# Patient Record
Sex: Male | Born: 1950 | Race: White | Hispanic: No | Marital: Married | Smoking: Current every day smoker
Health system: Southern US, Community
[De-identification: ages and names within clinical notes are randomized; demographics above are authoritative.]

## PROBLEM LIST (undated history)

## (undated) DIAGNOSIS — T7840XA Allergy, unspecified, initial encounter: Secondary | ICD-10-CM

## (undated) DIAGNOSIS — C61 Malignant neoplasm of prostate: Secondary | ICD-10-CM

## (undated) DIAGNOSIS — R972 Elevated prostate specific antigen [PSA]: Secondary | ICD-10-CM

## (undated) DIAGNOSIS — M1990 Unspecified osteoarthritis, unspecified site: Secondary | ICD-10-CM

## (undated) DIAGNOSIS — I10 Essential (primary) hypertension: Secondary | ICD-10-CM

## (undated) HISTORY — PX: JOINT REPLACEMENT: SHX530

## (undated) HISTORY — PX: KNEE SURGERY: SHX244

## (undated) HISTORY — PX: TONSILLECTOMY: SUR1361

## (undated) HISTORY — PX: APPENDECTOMY: SHX54

---

## 2014-07-31 ENCOUNTER — Telehealth: Payer: Self-pay | Admitting: Family Medicine

## 2014-07-31 NOTE — Telephone Encounter (Signed)
Pt given appt with Dr.Miller 4/5 at 2:30, advised to arrive 30 minutes prior.

## 2014-08-06 ENCOUNTER — Ambulatory Visit: Payer: Self-pay | Admitting: Family Medicine

## 2014-09-23 ENCOUNTER — Encounter (INDEPENDENT_AMBULATORY_CARE_PROVIDER_SITE_OTHER): Payer: Self-pay

## 2014-09-23 ENCOUNTER — Encounter: Payer: Self-pay | Admitting: Family Medicine

## 2014-09-23 ENCOUNTER — Ambulatory Visit (INDEPENDENT_AMBULATORY_CARE_PROVIDER_SITE_OTHER): Payer: BC Managed Care – PPO | Admitting: Family Medicine

## 2014-09-23 VITALS — BP 146/90 | HR 86 | Temp 97.3°F | Ht 67.0 in | Wt 240.0 lb

## 2014-09-23 DIAGNOSIS — L723 Sebaceous cyst: Secondary | ICD-10-CM

## 2014-09-23 DIAGNOSIS — L739 Follicular disorder, unspecified: Secondary | ICD-10-CM

## 2014-09-23 DIAGNOSIS — B356 Tinea cruris: Secondary | ICD-10-CM | POA: Diagnosis not present

## 2014-09-23 MED ORDER — TERBINAFINE HCL 250 MG PO TABS: 250.0000 mg | ORAL_TABLET | Freq: Every day | ORAL | Status: DC

## 2014-09-23 MED ORDER — NYSTATIN-TRIAMCINOLONE 100000-0.1 UNIT/GM-% EX CREA: 1.0000 "application " | TOPICAL_CREAM | Freq: Two times a day (BID) | CUTANEOUS | Status: DC

## 2014-09-23 NOTE — Patient Instructions (Signed)
Health Maintenance A healthy lifestyle and preventative care can promote health and wellness.  Maintain regular health, dental, and eye exams.  Eat a healthy diet. Foods like vegetables, fruits, whole grains, low-fat dairy products, and lean protein foods contain the nutrients you need and are low in calories. Decrease your intake of foods high in solid fats, added sugars, and salt. Get information about a proper diet from your health care provider, if necessary.  Regular physical exercise is one of the most important things you can do for your health. Most adults should get at least 150 minutes of moderate-intensity exercise (any activity that increases your heart rate and causes you to sweat) each week. In addition, most adults need muscle-strengthening exercises on 2 or more days a week.   Maintain a healthy weight. The body mass index (BMI) is a screening tool to identify possible weight problems. It provides an estimate of body fat based on height and weight. Your health care provider can find your BMI and can help you achieve or maintain a healthy weight. For males 20 years and older:  A BMI below 18.5 is considered underweight.  A BMI of 18.5 to 24.9 is normal.  A BMI of 25 to 29.9 is considered overweight.  A BMI of 30 and above is considered obese.  Maintain normal blood lipids and cholesterol by exercising and minimizing your intake of saturated fat. Eat a balanced diet with plenty of fruits and vegetables. Blood tests for lipids and cholesterol should begin at age 20 and be repeated every 5 years. If your lipid or cholesterol levels are high, you are over age 50, or you are at high risk for heart disease, you may need your cholesterol levels checked more frequently.Ongoing high lipid and cholesterol levels should be treated with medicines if diet and exercise are not working.  If you smoke, find out from your health care provider how to quit. If you do not use tobacco, do not  start.  Lung cancer screening is recommended for adults aged 55-80 years who are at high risk for developing lung cancer because of a history of smoking. A yearly low-dose CT scan of the lungs is recommended for people who have at least a 30-pack-year history of smoking and are current smokers or have quit within the past 15 years. A pack year of smoking is smoking an average of 1 pack of cigarettes a day for 1 year (for example, a 30-pack-year history of smoking could mean smoking 1 pack a day for 30 years or 2 packs a day for 15 years). Yearly screening should continue until the smoker has stopped smoking for at least 15 years. Yearly screening should be stopped for people who develop a health problem that would prevent them from having lung cancer treatment.  If you choose to drink alcohol, do not have more than 2 drinks per day. One drink is considered to be 12 oz (360 mL) of beer, 5 oz (150 mL) of wine, or 1.5 oz (45 mL) of liquor.  Avoid the use of street drugs. Do not share needles with anyone. Ask for help if you need support or instructions about stopping the use of drugs.  High blood pressure causes heart disease and increases the risk of stroke. Blood pressure should be checked at least every 1-2 years. Ongoing high blood pressure should be treated with medicines if weight loss and exercise are not effective.  If you are 45-79 years old, ask your health care provider if   you should take aspirin to prevent heart disease.  Diabetes screening involves taking a blood sample to check your fasting blood sugar level. This should be done once every 3 years after age 45 if you are at a normal weight and without risk factors for diabetes. Testing should be considered at a younger age or be carried out more frequently if you are overweight and have at least 1 risk factor for diabetes.  Colorectal cancer can be detected and often prevented. Most routine colorectal cancer screening begins at the age of 50  and continues through age 75. However, your health care provider may recommend screening at an earlier age if you have risk factors for colon cancer. On a yearly basis, your health care provider may provide home test kits to check for hidden blood in the stool. A small camera at the end of a tube may be used to directly examine the colon (sigmoidoscopy or colonoscopy) to detect the earliest forms of colorectal cancer. Talk to your health care provider about this at age 50 when routine screening begins. A direct exam of the colon should be repeated every 5-10 years through age 75, unless early forms of precancerous polyps or small growths are found.  People who are at an increased risk for hepatitis B should be screened for this virus. You are considered at high risk for hepatitis B if:  You were born in a country where hepatitis B occurs often. Talk with your health care provider about which countries are considered high risk.  Your parents were born in a high-risk country and you have not received a shot to protect against hepatitis B (hepatitis B vaccine).  You have HIV or AIDS.  You use needles to inject street drugs.  You live with, or have sex with, someone who has hepatitis B.  You are a man who has sex with other men (MSM).  You get hemodialysis treatment.  You take certain medicines for conditions like cancer, organ transplantation, and autoimmune conditions.  Hepatitis C blood testing is recommended for all people born from 1945 through 1965 and any individual with known risk factors for hepatitis C.  Healthy men should no longer receive prostate-specific antigen (PSA) blood tests as part of routine cancer screening. Talk to your health care provider about prostate cancer screening.  Testicular cancer screening is not recommended for adolescents or adult males who have no symptoms. Screening includes self-exam, a health care provider exam, and other screening tests. Consult with your  health care provider about any symptoms you have or any concerns you have about testicular cancer.  Practice safe sex. Use condoms and avoid high-risk sexual practices to reduce the spread of sexually transmitted infections (STIs).  You should be screened for STIs, including gonorrhea and chlamydia if:  You are sexually active and are younger than 24 years.  You are older than 24 years, and your health care provider tells you that you are at risk for this type of infection.  Your sexual activity has changed since you were last screened, and you are at an increased risk for chlamydia or gonorrhea. Ask your health care provider if you are at risk.  If you are at risk of being infected with HIV, it is recommended that you take a prescription medicine daily to prevent HIV infection. This is called pre-exposure prophylaxis (PrEP). You are considered at risk if:  You are a man who has sex with other men (MSM).  You are a heterosexual man who   is sexually active with multiple partners.  You take drugs by injection.  You are sexually active with a partner who has HIV.  Talk with your health care provider about whether you are at high risk of being infected with HIV. If you choose to begin PrEP, you should first be tested for HIV. You should then be tested every 3 months for as long as you are taking PrEP.  Use sunscreen. Apply sunscreen liberally and repeatedly throughout the day. You should seek shade when your shadow is shorter than you. Protect yourself by wearing long sleeves, pants, a wide-brimmed hat, and sunglasses year round whenever you are outdoors.  Tell your health care provider of new moles or changes in moles, especially if there is a change in shape or color. Also, tell your health care provider if a mole is larger than the size of a pencil eraser.  A one-time screening for abdominal aortic aneurysm (AAA) and surgical repair of large AAAs by ultrasound is recommended for men aged  68-75 years who are current or former smokers.  Stay current with your vaccines (immunizations). Document Released: 10/16/2007 Document Revised: 04/24/2013 Document Reviewed: 09/14/2010 [facility] Patient Information 2015 [place], [place]. This information is not intended to replace advice given to you by your health care provider. Make sure you discuss any questions you have with your health care provider.   Miconazole skin gel, lotion, ointment, or solution What is this medicine? MICONAZOLE (mi KON a zole) is an antifungal medicine. It is used to treat certain kinds of fungal or yeast infections of the skin. This medicine may be used for other purposes; ask your health care provider or pharmacist if you have questions. COMMON BRAND NAME(S): AZOLEN TINCTURE, Fungoid, Triple Paste AF, Zeasorb AF What should I tell my health care provider before I take this medicine? They need to know if you have any of these conditions: -diabetes -HIV or AIDS -immune system problems -other chronic health condition -recent chemotherapy treatments -an unusual or allergic reaction to miconazole, other medicines, foods, dyes or preservatives -pregnant or trying to get pregnant -breast-feeding How should I use this medicine? This medicine is for external use only. Do not take by mouth. Follow the directions on the label. Wash hands before and after use. If treating hands, only wash hands before use. Cleanse and dry affected area thoroughly. Apply a thin layer of this medicine to the affected skin and surrounding area. Do not get this medicine in your eyes. If you do, rinse out with plenty of cool tap water. You can cover the area with a sterile gauze dressing (bandage). Do not use an airtight bandage (such as a plastic-covered bandage). Do not use your medicine more often than directed. Use this medicine for the full amount of time recommended on the package or by your doctor or health care professional even if you  begin to feel better. Do not use for more than 4 weeks without advice. Talk to your pediatrician regarding the use of this medicine in children. Special care may be needed. Overdosage: If you think you have taken too much of this medicine contact a poison control center or emergency room at once. NOTE: This medicine is only for you. Do not share this medicine with others. What if I miss a dose? If you miss a dose, use it as soon as you can. If it is almost time for your next dose, use only that dose. Do not use double or extra doses. What may interact  with this medicine? Interactions are not expected. Do not use any other skin products on the affected area without asking your doctor or health care professional. This list may not describe all possible interactions. Give your health care provider a list of all the medicines, herbs, non-prescription drugs, or dietary supplements you use. Also tell them if you smoke, drink alcohol, or use illegal drugs. Some items may interact with your medicine. What should I watch for while using this medicine? Tell your doctor or healthcare professional if your symptoms do not start to get better or if they get worse. You may have a skin infection that does not respond to this medicine. If you are using this medicine for 'jock itch' be sure to dry the groin completely after bathing. Do not wear underwear that is tight-fitting or made from synthetic fibers like rayon or nylon. Wear loose-fitting, cotton underwear. If you are using this medicine for athlete's foot be sure to dry your feet carefully after bathing, especially between the toes. Do not wear socks made from wool or synthetic materials like rayon or nylon. Wear clean cotton socks and change them at least once a day, change them more if your feet sweat a lot. Also, try to wear sandals or shoes that are well-ventilated. What side effects may I notice from receiving this medicine? Side effects that you should  report to your doctor or health care professional as soon as possible: -allergic reactions like skin rash, itching or hives, swelling of the face, lips, or tongue -increased inflammation, redness, or pain at the affected area Side effects that usually do not require medical attention (report to your doctor or health care professional if they continue or are bothersome): -mild skin irritation, burning, or itching at the affected area This list may not describe all possible side effects. Call your doctor for medical advice about side effects. You may report side effects to FDA at 1-800-FDA-1088. Where should I keep my medicine? Keep out of the reach of children. Store at room temperature between 15 and 30 degrees C (59 and 86 degrees F). Throw away any unused medicine after the expiration date. NOTE: This sheet is a summary. It may not cover all possible information. If you have questions about this medicine, talk to your doctor, pharmacist, or health care provider.  2015, [employer]vier/[employer]. (2007-11-08 15:11:44)

## 2014-09-23 NOTE — Addendum Note (Signed)
Addended by: Ilean China on: 09/23/2014 08:55 AM   Modules accepted: Orders

## 2014-09-23 NOTE — Progress Notes (Signed)
   Subjective:    Patient ID: Darrell Beard., male    DOB: [birth year], 64 y.o.   MRN: [id number]  HPI first visit for this 64 year old gentleman with several skin concerns. First he has a cutaneous horn left nasolabial fold that he has treated with with over-the-counter liquid nitrogen without success. He also has a sebaceous cyst right flank area. There is also a jock itch and some redness and itching around the head of the penis. There is itching related to that. Also there is a rash on his upper back that has gotten worse since he started sleeping in a leather recliner.     Review of Systems  Constitutional: Negative.   Respiratory: Negative.   Cardiovascular: Negative.   Gastrointestinal: Negative.   Neurological: Negative.   Psychiatric/Behavioral: Negative.        Objective:   Physical Exam  Skin:  Cutaneous horn or papilloma on face frozen with liquid nitrogen  Rash ingrown 1 consistent with tinea cruris but also may have some monilia at the glans  Sebaceous cyst is not inflamed or infected but does not feel like a well-circumscribed cyst it is fluctuant however  Rash on back has small pustules consistent with folliculitis.     BP 146/90 mmHg  Pulse 86  Temp(Src) 97.3 F (36.3 C) (Oral)  Ht 5\' 7"  (1.702 m)  Wt 240 lb (108.863 kg)  BMI 37.58 kg/m2      Assessment & Plan:  1. Folliculitis Scrub skin with antibacterial soap such as Dial. Try to reduce sweating putting a towel between his back and the leather chair.  2. Sebaceous cyst This will need to be indeed in the future  3. Tinea cruris Rx Lamisil tabs 250 mg one a day for 10 days. Zeasorb-AF powder to keep moisture at minimal. Mycolog cream twice a day  Wardell Honour MD

## 2014-10-16 ENCOUNTER — Ambulatory Visit: Payer: BC Managed Care – PPO | Admitting: Physician Assistant

## 2015-05-14 ENCOUNTER — Ambulatory Visit (INDEPENDENT_AMBULATORY_CARE_PROVIDER_SITE_OTHER): Payer: BC Managed Care – PPO

## 2015-05-14 ENCOUNTER — Encounter: Payer: Self-pay | Admitting: Family Medicine

## 2015-05-14 ENCOUNTER — Ambulatory Visit (INDEPENDENT_AMBULATORY_CARE_PROVIDER_SITE_OTHER): Payer: BC Managed Care – PPO | Admitting: Family Medicine

## 2015-05-14 VITALS — BP 162/91 | HR 106 | Temp 98.3°F | Ht 67.0 in | Wt 244.0 lb

## 2015-05-14 DIAGNOSIS — R1030 Lower abdominal pain, unspecified: Secondary | ICD-10-CM

## 2015-05-14 DIAGNOSIS — K5732 Diverticulitis of large intestine without perforation or abscess without bleeding: Secondary | ICD-10-CM

## 2015-05-14 LAB — POCT URINALYSIS DIPSTICK
Bilirubin, UA: NEGATIVE
GLUCOSE UA: NEGATIVE
Ketones, UA: NEGATIVE
Leukocytes, UA: NEGATIVE
Nitrite, UA: NEGATIVE
Protein, UA: NEGATIVE
SPEC GRAV UA: 1.015
Urobilinogen, UA: NEGATIVE
pH, UA: 6

## 2015-05-14 LAB — POCT UA - MICROSCOPIC ONLY
Bacteria, U Microscopic: NEGATIVE
Casts, Ur, LPF, POC: NEGATIVE
Crystals, Ur, HPF, POC: NEGATIVE
MUCUS UA: NEGATIVE
WBC, Ur, HPF, POC: NEGATIVE
YEAST UA: NEGATIVE

## 2015-05-14 MED ORDER — METRONIDAZOLE 500 MG PO TABS: 500.0000 mg | ORAL_TABLET | Freq: Two times a day (BID) | ORAL | Status: DC

## 2015-05-14 MED ORDER — PSYLLIUM 28 % PO PACK: PACK | ORAL | Status: DC

## 2015-05-14 MED ORDER — CIPROFLOXACIN HCL 500 MG PO TABS: 500.0000 mg | ORAL_TABLET | Freq: Two times a day (BID) | ORAL | Status: DC

## 2015-05-14 NOTE — Progress Notes (Signed)
Subjective:  Patient ID: Darrell Beard., male    DOB: 10/03/1950  Age: 65 y.o. MRN: [id number]  CC: Abdominal Pain   HPI [name]. presents for 4 days of sharp stabbing "Like jabbed with an ice pick." Pain at suprapubic area. 6-7/10. INtermittent. LAsts sfrom a few minutes to several hours at a time. No NVD,C. Appetite good. No melena. On no meds currently. Smoking 2 packs of cigarettes a day in the past now down to half pack today. 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 He continues to try. Patient also denies previous episode of similar symptoms. This is not affected by food and his appetite is unaffected.  History Deepak has no past medical history on file.   He has past surgical history that includes Appendectomy; Tonsillectomy; and Knee surgery (Bilateral).   His family history includes Cancer in his father.He reports that he has been smoking.  He has never used smokeless tobacco. He reports that he drinks alcohol. He reports that he does not use illicit drugs.  Outpatient Prescriptions Prior to Visit  Medication Sig Dispense Refill  . nystatin-triamcinolone (MYCOLOG II) cream Apply 1 application topically 2 (two) times daily. 30 g 0  . terbinafine (LAMISIL) 250 MG tablet Take 1 tablet (250 mg total) by mouth daily. 14 tablet 0   No facility-administered medications prior to visit.    ROS Review of Systems  Constitutional: Negative for fever, chills, diaphoresis and unexpected weight change.  HENT: Negative for rhinorrhea and trouble swallowing.   Respiratory: Negative for cough, chest tightness and shortness of breath.   Cardiovascular: Negative for chest pain.  Gastrointestinal: Positive for abdominal pain. Negative for nausea, vomiting, diarrhea, constipation, blood in stool, abdominal distention and rectal pain.  Genitourinary: Negative for dysuria, hematuria and flank pain.    Musculoskeletal: Negative for joint swelling and arthralgias.  Skin: Negative for rash.  Neurological: Negative for syncope and headaches.    Objective:  BP 162/91 mmHg  Pulse 106  Temp(Src) 98.3 F (36.8 C) (Oral)  Ht _0  (1.702 m)  Wt 244 lb (110.678 kg)  BMI 38.21 kg/m2  SpO2 99%  BP Readings from Last 3 Encounters:  05/14/15 162/91  09/23/14 146/90    Wt Readings from Last 3 Encounters:  05/14/15 244 lb (110.678 kg)  09/23/14 240 lb (108.863 kg)     Physical Exam  Constitutional: He is oriented to person, place, and time. He appears well-developed and well-nourished. No distress.  HENT:  Head: Normocephalic and atraumatic.  Right Ear: External ear normal.  Left Ear: External ear normal.  Nose: Nose normal.  Mouth/Throat: Oropharynx is clear and moist.  Eyes: Conjunctivae and EOM are normal. Pupils are equal, round, and reactive to light.  Neck: Normal range of motion. Neck supple. No thyromegaly present.  Cardiovascular: Normal rate, regular rhythm and normal heart sounds.   No murmur heard. Pulmonary/Chest: Effort normal and breath sounds normal. No respiratory distress. He has no wheezes. He has no rales.  Abdominal: Soft. Bowel sounds are normal. He exhibits distension. He exhibits no mass. There is tenderness (LLQ, moderate and into suprapubic region just to right of midline.). There is guarding (mild). There is no rebound.  Lymphadenopathy:    He has no cervical adenopathy.  Neurological: He is alert and oriented to person, place, and time. He has normal reflexes.  Skin: Skin is warm and dry.  Psychiatric:  He has a normal mood and affect. His behavior is normal. Judgment and thought content normal.     Results for orders placed or performed in visit on 05/14/15  Amylase  Result Value Ref Range   Amylase 41 31 - 124 U/L  CBC with Differential/Platelet  Result Value Ref Range   WBC 14.0 (H) 3.4 - 10.8 x10E3/uL   RBC 5.16 4.14 - 5.80 x10E6/uL    Hemoglobin 16.1 12.6 - 17.7 g/dL   Hematocrit 46.4 37.5 - 51.0 %   MCV 90 79 - 97 fL   MCH 31.2 26.6 - 33.0 pg   MCHC 34.7 31.5 - 35.7 g/dL   RDW 13.2 12.3 - 15.4 %   Platelets 319 150 - 379 x10E3/uL   Neutrophils 72 %   Lymphs 15 %   Monocytes 11 %   Eos 1 %   Basos 1 %   Neutrophils Absolute 10.0 (H) 1.4 - 7.0 x10E3/uL   Lymphocytes Absolute 2.2 0.7 - 3.1 x10E3/uL   Monocytes Absolute 1.6 (H) 0.1 - 0.9 x10E3/uL   EOS (ABSOLUTE) 0.1 0.0 - 0.4 x10E3/uL   Basophils Absolute 0.1 0.0 - 0.2 x10E3/uL   Immature Granulocytes 0 %   Immature Grans (Abs) 0.0 0.0 - 0.1 x10E3/uL  CMP14+EGFR  Result Value Ref Range   Glucose 95 65 - 99 mg/dL   BUN 13 8 - 27 mg/dL   Creatinine, Ser 1.10 0.76 - 1.27 mg/dL   GFR calc non Af Amer 71 >59 mL/min/1.73   GFR calc Af Amer 82 >59 mL/min/1.73   BUN/Creatinine Ratio 12 10 - 22   Sodium 141 134 - 144 mmol/L   Potassium 4.6 3.5 - 5.2 mmol/L   Chloride 98 96 - 106 mmol/L   CO2 25 18 - 29 mmol/L   Calcium 9.2 8.6 - 10.2 mg/dL   Total Protein 6.7 6.0 - 8.5 g/dL   Albumin 3.8 3.6 - 4.8 g/dL   Globulin, Total 2.9 1.5 - 4.5 g/dL   Albumin/Globulin Ratio 1.3 1.1 - 2.5   Bilirubin Total 0.7 0.0 - 1.2 mg/dL   Alkaline Phosphatase 85 39 - 117 IU/L   AST 16 0 - 40 IU/L   ALT 18 0 - 44 IU/L  Lipase  Result Value Ref Range   Lipase 26 0 - 59 U/L  Hepatitis C antibody  Result Value Ref Range   Hep C Virus Ab <0.1 0.0 - 0.9 s/co ratio  POCT urinalysis dipstick  Result Value Ref Range   Color, UA straw    Clarity, UA clear    Glucose, UA neg    Bilirubin, UA neg    Ketones, UA neg    Spec Grav, UA 1.015    Blood, UA trace    pH, UA 6.0    Protein, UA neg    Urobilinogen, UA negative    Nitrite, UA neg    Leukocytes, UA Negative Negative  POCT UA - Microscopic Only  Result Value Ref Range   WBC, Ur, HPF, POC neg    RBC, urine, microscopic 1-5    Bacteria, U Microscopic neg    Mucus, UA neg    Epithelial cells, urine per micros occ     Crystals, Ur, HPF, POC neg    Casts, Ur, LPF, POC neg    Yeast, UA neg     Patient was never admitted.  Assessment & Plan:   Anthony was seen today for abdominal pain.  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 -     ciprofloxacin (CIPRO) 500 MG tablet; Take 1 tablet (500 mg total) by mouth 2 (two) times daily. -     psyllium (METAMUCIL SMOOTH TEXTURE) 28 % packet; One tbsp twice daily for bowels   I have discontinued Mr. Stgermaine terbinafine and nystatin-triamcinolone. I am also having him start on metroNIDAZOLE, ciprofloxacin, and psyllium.  Meds ordered this encounter  Medications  . metroNIDAZOLE (FLAGYL) 500 MG tablet    Sig: Take 1 tablet (500 mg total) by mouth 2 (two) times daily.    Dispense:  20 tablet    Refill:  0  . ciprofloxacin (CIPRO) 500 MG tablet    Sig: Take 1 tablet (500 mg total) by mouth 2 (two) times daily.    Dispense:  20 tablet    Refill:  0  . psyllium (METAMUCIL SMOOTH TEXTURE) 28 % packet    Sig: One tbsp twice daily for bowels    Dispense:  60 packet    Refill:  11     Follow-up: Return if symptoms worsen or fail to improve.  Claretta Fraise, M.D.

## 2015-05-14 NOTE — Patient Instructions (Signed)
Diverticulitis °Diverticulitis is inflammation or infection of small pouches in your colon that form when you have a condition called diverticulosis. The pouches in your colon are called diverticula. Your colon, or large intestine, is where water is absorbed and stool is formed. °Complications of diverticulitis can include: °· Bleeding. °· Severe infection. °· Severe pain. °· Perforation of your colon. °· Obstruction of your colon. °CAUSES  °Diverticulitis is caused by bacteria. °Diverticulitis happens when stool becomes trapped in diverticula. This allows bacteria to grow in the diverticula, which can lead to inflammation and infection. °RISK FACTORS °People with diverticulosis are at risk for diverticulitis. Eating a diet that does not include enough fiber from fruits and vegetables may make diverticulitis more likely to develop. °SYMPTOMS  °Symptoms of diverticulitis may include: °· Abdominal pain and tenderness. The pain is normally located on the left side of the abdomen, but may occur in other areas. °· Fever and chills. °· Bloating. °· Cramping. °· Nausea. °· Vomiting. °· Constipation. °· Diarrhea. °· Blood in your stool. °DIAGNOSIS  °Your health care provider will ask you about your medical history and do a physical exam. You may need to have tests done because many medical conditions can cause the same symptoms as diverticulitis. Tests may include: °· Blood tests. °· Urine tests. °· Imaging tests of the abdomen, including X-rays and CT scans. °When your condition is under control, your health care provider may recommend that you have a colonoscopy. A colonoscopy can show how severe your diverticula are and whether something else is causing your symptoms. °TREATMENT  °Most cases of diverticulitis are mild and can be treated at home. Treatment may include: °· Taking over-the-counter pain medicines. °· Following a clear liquid diet. °· Taking antibiotic medicines by mouth for 7-10 days. °More severe cases may  be treated at a hospital. Treatment may include: °· Not eating or drinking. °· Taking prescription pain medicine. °· Receiving antibiotic medicines through an IV tube. °· Receiving fluids and nutrition through an IV tube. °· Surgery. °HOME CARE INSTRUCTIONS  °· Follow your health care provider's instructions carefully. °· Follow a full liquid diet or other diet as directed by your health care provider. After your symptoms improve, your health care provider may tell you to change your diet. He or she may recommend you eat a high-fiber diet. Fruits and vegetables are good sources of fiber. Fiber makes it easier to pass stool. °· Take fiber supplements or probiotics as directed by your health care provider. °· Only take medicines as directed by your health care provider. °· Keep all your follow-up appointments. °SEEK MEDICAL CARE IF:  °· Your pain does not improve. °· You have a hard time eating food. °· Your bowel movements do not return to normal. °SEEK IMMEDIATE MEDICAL CARE IF:  °· Your pain becomes worse. °· Your symptoms do not get better. °· Your symptoms suddenly get worse. °· You have a fever. °· You have repeated vomiting. °· You have bloody or black, tarry stools. °MAKE SURE YOU:  °· Understand these instructions. °· Will watch your condition. °· Will get help right away if you are not doing well or get worse. °  °This information is not intended to replace advice given to you by your health care provider. Make sure you discuss any questions you have with your health care provider. °  °Document Released: 01/27/2005 Document Revised: 04/24/2013 Document Reviewed: 03/14/2013 °Elsevier Interactive Patient Education ©2016 [employer]. ° °

## 2015-05-15 LAB — CBC WITH DIFFERENTIAL/PLATELET
BASOS ABS: 0.1 10*3/uL (ref 0.0–0.2)
Basos: 1 %
EOS (ABSOLUTE): 0.1 10*3/uL (ref 0.0–0.4)
Eos: 1 %
Hematocrit: 46.4 % (ref 37.5–51.0)
Hemoglobin: 16.1 g/dL (ref 12.6–17.7)
IMMATURE GRANULOCYTES: 0 %
Immature Grans (Abs): 0 10*3/uL (ref 0.0–0.1)
Lymphocytes Absolute: 2.2 10*3/uL (ref 0.7–3.1)
Lymphs: 15 %
MCH: 31.2 pg (ref 26.6–33.0)
MCHC: 34.7 g/dL (ref 31.5–35.7)
MCV: 90 fL (ref 79–97)
MONOS ABS: 1.6 10*3/uL — AB (ref 0.1–0.9)
Monocytes: 11 %
Neutrophils Absolute: 10 10*3/uL — ABNORMAL HIGH (ref 1.4–7.0)
Neutrophils: 72 %
Platelets: 319 10*3/uL (ref 150–379)
RBC: 5.16 x10E6/uL (ref 4.14–5.80)
RDW: 13.2 % (ref 12.3–15.4)
WBC: 14 10*3/uL — AB (ref 3.4–10.8)

## 2015-05-15 LAB — LIPASE: LIPASE: 26 U/L (ref 0–59)

## 2015-05-15 LAB — CMP14+EGFR
ALK PHOS: 85 IU/L (ref 39–117)
ALT: 18 IU/L (ref 0–44)
AST: 16 IU/L (ref 0–40)
Albumin/Globulin Ratio: 1.3 (ref 1.1–2.5)
Albumin: 3.8 g/dL (ref 3.6–4.8)
BUN/Creatinine Ratio: 12 (ref 10–22)
BUN: 13 mg/dL (ref 8–27)
Bilirubin Total: 0.7 mg/dL (ref 0.0–1.2)
CALCIUM: 9.2 mg/dL (ref 8.6–10.2)
CO2: 25 mmol/L (ref 18–29)
CREATININE: 1.1 mg/dL (ref 0.76–1.27)
Chloride: 98 mmol/L (ref 96–106)
GFR calc Af Amer: 82 mL/min/{1.73_m2} (ref >59)
GFR, EST NON AFRICAN AMERICAN: 71 mL/min/{1.73_m2} (ref >59)
GLUCOSE: 95 mg/dL (ref 65–99)
Globulin, Total: 2.9 g/dL (ref 1.5–4.5)
Potassium: 4.6 mmol/L (ref 3.5–5.2)
Sodium: 141 mmol/L (ref 134–144)
Total Protein: 6.7 g/dL (ref 6.0–8.5)

## 2015-05-15 LAB — HEPATITIS C ANTIBODY: Hep C Virus Ab: <0.1 s/co ratio (ref 0.0–0.9)

## 2015-05-15 LAB — AMYLASE: AMYLASE: 41 U/L (ref 31–124)

## 2017-08-24 ENCOUNTER — Ambulatory Visit (INDEPENDENT_AMBULATORY_CARE_PROVIDER_SITE_OTHER): Payer: BC Managed Care – PPO | Admitting: Family Medicine

## 2017-08-24 ENCOUNTER — Encounter: Payer: Self-pay | Admitting: Family Medicine

## 2017-08-24 VITALS — BP 144/79 | HR 87 | Temp 97.3°F | Ht 67.0 in | Wt 251.1 lb

## 2017-08-24 DIAGNOSIS — I10 Essential (primary) hypertension: Secondary | ICD-10-CM | POA: Diagnosis not present

## 2017-08-24 MED ORDER — AMLODIPINE BESYLATE 5 MG PO TABS: 5.0000 mg | ORAL_TABLET | Freq: Every day | ORAL | 1 refills | Status: DC

## 2017-08-24 NOTE — Progress Notes (Signed)
Subjective:  Patient ID: Darrell Graves., male    DOB: 12-29-1950  Age: 67 y.o. MRN: [id number]  CC: Hypertension   HPI Darrell Beard. presents for follow-up of hypertension. Patient has no history of headache chest pain or shortness of breath or recent cough. Patient also denies symptoms of TIA such as numbness weakness lateralizing. Patient checks  blood pressure at home and has not had any elevated readings recently. Patient denies side effects from his medication. States taking it regularly.   Depression screen [facility] 2/9 05/14/2015 09/23/2014  Decreased Interest 0 0  Down, Depressed, Hopeless 0 0  PHQ - 2 Score 0 0    History Darrell Beard has no past medical history on file.   He has a past surgical history that includes Appendectomy; Tonsillectomy; and Knee surgery (Bilateral).   His family history includes Cancer in his father.He reports that he has been smoking.  He has a 35.00 pack-year smoking history. He has never used smokeless tobacco. He reports that he drinks alcohol. He reports that he does not use drugs.    ROS Review of Systems  Constitutional: Negative.   HENT: Negative.   Eyes: Negative for visual disturbance.  Respiratory: Negative for cough and shortness of breath.   Cardiovascular: Negative for chest pain and leg swelling.  Gastrointestinal: Negative for abdominal pain, diarrhea, nausea and vomiting.  Genitourinary: Negative for difficulty urinating.  Musculoskeletal: Negative for arthralgias and myalgias.  Skin: Negative for rash.  Neurological: Negative for headaches.  Psychiatric/Behavioral: Negative for sleep disturbance.    Objective:  BP (!) 144/79   Pulse 87   Temp (!) 97.3 F (36.3 C) (Oral)   Ht 5\' 7"  (1.702 m)   Wt 251 lb 2 oz (113.9 kg)   BMI 39.33 kg/m   BP Readings from Last 3 Encounters:  08/24/17 (!) 144/79  05/14/15 (!) 162/91  09/23/14 (!) 146/90    Wt Readings from Last 3 Encounters:  08/24/17 251 lb 2 oz (113.9 kg)    05/14/15 244 lb (110.7 kg)  09/23/14 240 lb (108.9 kg)     Physical Exam  Constitutional: He is oriented to person, place, and time. He appears well-developed and well-nourished. No distress.  HENT:  Head: Normocephalic and atraumatic.  Right Ear: External ear normal.  Left Ear: External ear normal.  Nose: Nose normal.  Mouth/Throat: Oropharynx is clear and moist.  Eyes: Pupils are equal, round, and reactive to light. Conjunctivae and EOM are normal.  Neck: Normal range of motion. Neck supple. No thyromegaly present.  Cardiovascular: Normal rate, regular rhythm and normal heart sounds.  No murmur heard. Pulmonary/Chest: Effort normal and breath sounds normal. No respiratory distress. He has no wheezes. He has no rales.  Abdominal: Soft. Bowel sounds are normal. He exhibits no distension. There is no tenderness.  Lymphadenopathy:    He has no cervical adenopathy.  Neurological: He is alert and oriented to person, place, and time. He has normal reflexes.  Skin: Skin is warm and dry.  Psychiatric: He has a normal mood and affect. His behavior is normal. Judgment and thought content normal.      Assessment & Plan:   Darrell Beard was seen today for hypertension.  Diagnoses and all orders for this visit:  Essential hypertension  Other orders -     amLODipine (NORVASC) 5 MG tablet; Take 1 tablet (5 mg total) by mouth daily.       I have discontinued Darrell Alcide Jr.'s metroNIDAZOLE, ciprofloxacin, and psyllium. I am  also having him start on amLODipine.  Allergies as of 08/24/2017      Reactions   Penicillins Anaphylaxis   Given when he was a child and told that he almost died from it      Medication List        Accurate as of 08/24/17 11:59 PM. Always use your most recent med list.          amLODipine 5 MG tablet Commonly known as:  NORVASC Take 1 tablet (5 mg total) by mouth daily.        Follow-up: Return in about 2 weeks (around 09/07/2017) for Wellness,  hypertension.  Claretta Fraise, M.D.

## 2017-09-06 ENCOUNTER — Encounter: Payer: BC Managed Care – PPO | Admitting: Family Medicine

## 2017-09-22 DIAGNOSIS — H2589 Other age-related cataract: Secondary | ICD-10-CM | POA: Insufficient documentation

## 2017-10-12 ENCOUNTER — Telehealth: Payer: Self-pay | Admitting: *Deleted

## 2017-10-12 MED ORDER — AMLODIPINE BESYLATE 5 MG PO TABS: 5.0000 mg | ORAL_TABLET | Freq: Every day | ORAL | 0 refills | Status: DC

## 2017-10-12 NOTE — Telephone Encounter (Signed)
LMOVM 1 mos refill sent to pharmacy 

## 2017-10-24 ENCOUNTER — Ambulatory Visit: Payer: BC Managed Care – PPO | Admitting: Family Medicine

## 2017-10-24 ENCOUNTER — Telehealth: Payer: Self-pay | Admitting: Family Medicine

## 2017-10-24 NOTE — Telephone Encounter (Signed)
Patient's prescription for Amlodipine was sent to [facility] on 10/12/17.  Patient's wife is going back to [facility] when they open and if there is a problem she will have them call our office.

## 2017-11-09 ENCOUNTER — Ambulatory Visit: Payer: BC Managed Care – PPO | Admitting: Family Medicine

## 2017-11-09 ENCOUNTER — Ambulatory Visit (INDEPENDENT_AMBULATORY_CARE_PROVIDER_SITE_OTHER): Payer: BC Managed Care – PPO

## 2017-11-09 ENCOUNTER — Encounter: Payer: Self-pay | Admitting: Family Medicine

## 2017-11-09 VITALS — BP 138/80 | HR 97 | Ht 67.0 in | Wt 250.2 lb

## 2017-11-09 DIAGNOSIS — I10 Essential (primary) hypertension: Secondary | ICD-10-CM | POA: Diagnosis not present

## 2017-11-09 DIAGNOSIS — Z1321 Encounter for screening for nutritional disorder: Secondary | ICD-10-CM | POA: Diagnosis not present

## 2017-11-09 DIAGNOSIS — M25362 Other instability, left knee: Secondary | ICD-10-CM

## 2017-11-09 DIAGNOSIS — Z13220 Encounter for screening for lipoid disorders: Secondary | ICD-10-CM | POA: Diagnosis not present

## 2017-11-09 DIAGNOSIS — M1712 Unilateral primary osteoarthritis, left knee: Secondary | ICD-10-CM

## 2017-11-09 DIAGNOSIS — Z125 Encounter for screening for malignant neoplasm of prostate: Secondary | ICD-10-CM

## 2017-11-09 DIAGNOSIS — D1779 Benign lipomatous neoplasm of other sites: Secondary | ICD-10-CM

## 2017-11-09 MED ORDER — AMLODIPINE BESYLATE 5 MG PO TABS: 5.0000 mg | ORAL_TABLET | Freq: Every day | ORAL | 1 refills | Status: DC

## 2017-11-09 NOTE — Progress Notes (Signed)
Subjective:  Patient ID: Darrell Beard., male    DOB: 11/28/1950  Age: 67 y.o. MRN: [id number]  CC: Medical Management of Chronic Issues (pt here today c/o left knee weakness and "a spot" on his back his wife would like looked at)   HPI Darrell Beard. presents for  follow-up of hypertension. Patient has no history of headache chest pain or shortness of breath or recent cough. Patient also denies symptoms of TIA such as focal numbness or weakness. Patient denies side effects from medication. States taking it regularly.  Patient checks his own blood pressure or has his daughter do it several times a week.  He brings in a list that he is actually compiled for his cardiologist.  On review it shows most of the readings are in the 1 48-2 40 range systolic and in the low 50I diastolic.  These range back over 6 months measured half dozen times a month.   History Drago has no past medical history on file.   He has a past surgical history that includes Appendectomy; Tonsillectomy; and Knee surgery (Bilateral).   His family history includes Cancer in his father.He reports that he has been smoking.  He has a 35.00 pack-year smoking history. He has never used smokeless tobacco. He reports that he drinks alcohol. He reports that he does not use drugs.  No current outpatient medications on file prior to visit.   No current facility-administered medications on file prior to visit.     ROS Review of Systems  Constitutional: Negative for fever.  Respiratory: Negative for shortness of breath.   Cardiovascular: Negative for chest pain.  Musculoskeletal: Positive for arthralgias (Left knee injury many years ago.  It gives way on him occasionally and is sore but weakness is his main concern).  Skin: Negative for rash.       Lesion at right flank.  His wife is worried about it.  Is not giving any pain and it is not bigger.  Is been there for years.  See picture below.      Objective:  BP 138/80    Pulse 97   Ht _0  (1.702 m)   Wt 250 lb 4 oz (113.5 kg)   BMI 39.19 kg/m   BP Readings from Last 3 Encounters:  11/09/17 138/80  08/24/17 (!) 144/79  05/14/15 (!) 162/91    Wt Readings from Last 3 Encounters:  11/09/17 250 lb 4 oz (113.5 kg)  08/24/17 251 lb 2 oz (113.9 kg)  05/14/15 244 lb (110.7 kg)     Physical Exam    Assessment & Plan:   Elaine was seen today for medical management of chronic issues.  Diagnoses and all orders for this visit:  Essential hypertension -     CBC with Differential/Platelet -     CMP14+EGFR  Arthritis of left knee -     DG Knee 1-2 Views Left; Future -     Ambulatory referral to Physical Therapy  Screening cholesterol level -     Lipid panel  Encounter for vitamin deficiency screening -     VITAMIN D 25 Hydroxy (Vit-D Deficiency, Fractures)  Special screening for malignant neoplasm of prostate -     PSA, total and free  Lipoma of other specified sites -     Ambulatory referral to Dermatology  Other orders -     amLODipine (NORVASC) 5 MG tablet; Take 1 tablet (5 mg total) by mouth daily.   Allergies as of 11/09/2017  Reactions   Penicillins Anaphylaxis   Given when he was a child and told that he almost died from it      Medication List        Accurate as of 11/09/17 11:27 AM. Always use your most recent med list.          amLODipine 5 MG tablet Commonly known as:  NORVASC Take 1 tablet (5 mg total) by mouth daily.       Meds ordered this encounter  Medications  . amLODipine (NORVASC) 5 MG tablet    Sig: Take 1 tablet (5 mg total) by mouth daily.    Dispense:  90 tablet    Refill:  1    Continue meds as is.  See if physical therapy can help strengthen the knee and surrounding muscle tissue as well as tightening the ligaments and tendons.  If not, orthopedic referral for definitive care may become necessary.  Follow-up: Return in about 6 months (around 05/12/2018).  Claretta Fraise, M.D.

## 2017-11-09 NOTE — Patient Instructions (Signed)
DASH Eating Plan DASH stands for "Dietary Approaches to Stop Hypertension." The DASH eating plan is a healthy eating plan that has been shown to reduce high blood pressure (hypertension). It may also reduce your risk for type 2 diabetes, heart disease, and stroke. The DASH eating plan may also help with weight loss. What are tips for following this plan? General guidelines  Avoid eating more than 2,300 mg (milligrams) of salt (sodium) a day. If you have hypertension, you may need to reduce your sodium intake to 1,500 mg a day.  Limit alcohol intake to no more than 1 drink a day for nonpregnant women and 2 drinks a day for men. One drink equals 12 oz of beer, 5 oz of wine, or 1 oz of hard liquor.  Work with your health care provider to maintain a healthy body weight or to lose weight. Ask what an ideal weight is for you.  Get at least 30 minutes of exercise that causes your heart to beat faster (aerobic exercise) most days of the week. Activities may include walking, swimming, or biking.  Work with your health care provider or diet and nutrition specialist (dietitian) to adjust your eating plan to your individual calorie needs. Reading food labels  Check food labels for the amount of sodium per serving. Choose foods with less than 5 percent of the Daily Value of sodium. Generally, foods with less than 300 mg of sodium per serving fit into this eating plan.  To find whole grains, look for the word "whole" as the first word in the ingredient list. Shopping  Buy products labeled as "low-sodium" or "no salt added."  Buy fresh foods. Avoid canned foods and premade or frozen meals. Cooking  Avoid adding salt when cooking. Use salt-free seasonings or herbs instead of table salt or sea salt. Check with your health care provider or pharmacist before using salt substitutes.  Do not fry foods. Cook foods using healthy methods such as baking, boiling, grilling, and broiling instead.  Cook with  heart-healthy oils, such as olive, canola, soybean, or sunflower oil. Meal planning   Eat a balanced diet that includes: ? 5 or more servings of fruits and vegetables each day. At each meal, try to fill half of your plate with fruits and vegetables. ? Up to 6-8 servings of whole grains each day. ? Less than 6 oz of lean meat, poultry, or fish each day. A 3-oz serving of meat is about the same size as a deck of cards. One egg equals 1 oz. ? 2 servings of low-fat dairy each day. ? A serving of nuts, seeds, or beans 5 times each week. ? Heart-healthy fats. Healthy fats called Omega-3 fatty acids are found in foods such as flaxseeds and coldwater fish, like sardines, salmon, and mackerel.  Limit how much you eat of the following: ? Canned or prepackaged foods. ? Food that is high in trans fat, such as fried foods. ? Food that is high in saturated fat, such as fatty meat. ? Sweets, desserts, sugary drinks, and other foods with added sugar. ? Full-fat dairy products.  Do not salt foods before eating.  Try to eat at least 2 vegetarian meals each week.  Eat more home-cooked food and less restaurant, buffet, and fast food.  When eating at a restaurant, ask that your food be prepared with less salt or no salt, if possible. What foods are recommended? The items listed may not be a complete list. Talk with your dietitian about what   dietary choices are best for you. Grains Whole-grain or whole-wheat bread. Whole-grain or whole-wheat pasta. Brown rice. Oatmeal. Quinoa. Bulgur. Whole-grain and low-sodium cereals. Pita bread. Low-fat, low-sodium crackers. Whole-wheat flour tortillas. Vegetables Fresh or frozen vegetables (raw, steamed, roasted, or grilled). Low-sodium or reduced-sodium tomato and vegetable juice. Low-sodium or reduced-sodium tomato sauce and tomato paste. Low-sodium or reduced-sodium canned vegetables. Fruits All fresh, dried, or frozen fruit. Canned fruit in natural juice (without  added sugar). Meat and other protein foods Skinless chicken or turkey. Ground chicken or turkey. Pork with fat trimmed off. Fish and seafood. Egg whites. Dried beans, peas, or lentils. Unsalted nuts, nut butters, and seeds. Unsalted canned beans. Lean cuts of beef with fat trimmed off. Low-sodium, lean deli meat. Dairy Low-fat (1%) or fat-free (skim) milk. Fat-free, low-fat, or reduced-fat cheeses. Nonfat, low-sodium ricotta or cottage cheese. Low-fat or nonfat yogurt. Low-fat, low-sodium cheese. Fats and oils Soft margarine without trans fats. Vegetable oil. Low-fat, reduced-fat, or light mayonnaise and salad dressings (reduced-sodium). Canola, safflower, olive, soybean, and sunflower oils. Avocado. Seasoning and other foods Herbs. Spices. Seasoning mixes without salt. Unsalted popcorn and pretzels. Fat-free sweets. What foods are not recommended? The items listed may not be a complete list. Talk with your dietitian about what dietary choices are best for you. Grains Baked goods made with fat, such as croissants, muffins, or some breads. Dry pasta or rice meal packs. Vegetables Creamed or fried vegetables. Vegetables in a cheese sauce. Regular canned vegetables (not low-sodium or reduced-sodium). Regular canned tomato sauce and paste (not low-sodium or reduced-sodium). Regular tomato and vegetable juice (not low-sodium or reduced-sodium). Pickles. Olives. Fruits Canned fruit in a light or heavy syrup. Fried fruit. Fruit in cream or butter sauce. Meat and other protein foods Fatty cuts of meat. Ribs. Fried meat. Bacon. Sausage. Bologna and other processed lunch meats. Salami. Fatback. Hotdogs. Bratwurst. Salted nuts and seeds. Canned beans with added salt. Canned or smoked fish. Whole eggs or egg yolks. Chicken or turkey with skin. Dairy Whole or 2% milk, cream, and half-and-half. Whole or full-fat cream cheese. Whole-fat or sweetened yogurt. Full-fat cheese. Nondairy creamers. Whipped toppings.  Processed cheese and cheese spreads. Fats and oils Butter. Stick margarine. Lard. Shortening. Ghee. Bacon fat. Tropical oils, such as coconut, palm kernel, or palm oil. Seasoning and other foods Salted popcorn and pretzels. Onion salt, garlic salt, seasoned salt, table salt, and sea salt. Worcestershire sauce. Tartar sauce. Barbecue sauce. Teriyaki sauce. Soy sauce, including reduced-sodium. Steak sauce. Canned and packaged gravies. Fish sauce. Oyster sauce. Cocktail sauce. Horseradish that you find on the shelf. Ketchup. Mustard. Meat flavorings and tenderizers. Bouillon cubes. Hot sauce and Tabasco sauce. Premade or packaged marinades. Premade or packaged taco seasonings. Relishes. Regular salad dressings. Where to find more information:  National Heart, Lung, and Blood Institute: www.nhlbi.nih.gov  American Heart Association: www.heart.org Summary  The DASH eating plan is a healthy eating plan that has been shown to reduce high blood pressure (hypertension). It may also reduce your risk for type 2 diabetes, heart disease, and stroke.  With the DASH eating plan, you should limit salt (sodium) intake to 2,300 mg a day. If you have hypertension, you may need to reduce your sodium intake to 1,500 mg a day.  When on the DASH eating plan, aim to eat more fresh fruits and vegetables, whole grains, lean proteins, low-fat dairy, and heart-healthy fats.  Work with your health care provider or diet and nutrition specialist (dietitian) to adjust your eating plan to your individual   calorie needs. This information is not intended to replace advice given to you by your health care provider. Make sure you discuss any questions you have with your health care provider. Document Released: 04/08/2011 Document Revised: 04/12/2016 Document Reviewed: 04/12/2016 Elsevier Interactive Patient Education  2018 [employer].  

## 2017-11-10 ENCOUNTER — Other Ambulatory Visit: Payer: Self-pay | Admitting: Family Medicine

## 2017-11-10 DIAGNOSIS — R972 Elevated prostate specific antigen [PSA]: Secondary | ICD-10-CM

## 2017-11-10 LAB — CMP14+EGFR
ALBUMIN: 3.9 g/dL (ref 3.6–4.8)
ALK PHOS: 76 IU/L (ref 39–117)
ALT: 19 IU/L (ref 0–44)
AST: 16 IU/L (ref 0–40)
Albumin/Globulin Ratio: 1.5 (ref 1.2–2.2)
BUN / CREAT RATIO: 14 (ref 10–24)
BUN: 16 mg/dL (ref 8–27)
Bilirubin Total: 0.4 mg/dL (ref 0.0–1.2)
CALCIUM: 9 mg/dL (ref 8.6–10.2)
CO2: 23 mmol/L (ref 20–29)
CREATININE: 1.13 mg/dL (ref 0.76–1.27)
Chloride: 104 mmol/L (ref 96–106)
GFR calc Af Amer: 78 mL/min/{1.73_m2} (ref >59)
GFR calc non Af Amer: 68 mL/min/{1.73_m2} (ref >59)
GLOBULIN, TOTAL: 2.6 g/dL (ref 1.5–4.5)
GLUCOSE: 104 mg/dL — AB (ref 65–99)
Potassium: 4.8 mmol/L (ref 3.5–5.2)
SODIUM: 141 mmol/L (ref 134–144)
Total Protein: 6.5 g/dL (ref 6.0–8.5)

## 2017-11-10 LAB — LIPID PANEL
CHOL/HDL RATIO: 3.5 ratio (ref 0.0–5.0)
CHOLESTEROL TOTAL: 159 mg/dL (ref 100–199)
HDL: 46 mg/dL (ref >39)
LDL CALC: 100 mg/dL — AB (ref 0–99)
Triglycerides: 65 mg/dL (ref 0–149)
VLDL Cholesterol Cal: 13 mg/dL (ref 5–40)

## 2017-11-10 LAB — PSA, TOTAL AND FREE
PROSTATE SPECIFIC AG, SERUM: 5.5 ng/mL — AB (ref 0.0–4.0)
PSA FREE: 0.68 ng/mL
PSA, Free Pct: 12.4 %

## 2017-11-10 LAB — CBC WITH DIFFERENTIAL/PLATELET
BASOS ABS: 0.1 10*3/uL (ref 0.0–0.2)
BASOS: 1 %
EOS (ABSOLUTE): 0.1 10*3/uL (ref 0.0–0.4)
Eos: 2 %
HEMOGLOBIN: 17.2 g/dL (ref 13.0–17.7)
Hematocrit: 49.8 % (ref 37.5–51.0)
IMMATURE GRANULOCYTES: 1 %
Immature Grans (Abs): 0.1 10*3/uL (ref 0.0–0.1)
LYMPHS: 26 %
Lymphocytes Absolute: 1.9 10*3/uL (ref 0.7–3.1)
MCH: 31.9 pg (ref 26.6–33.0)
MCHC: 34.5 g/dL (ref 31.5–35.7)
MCV: 92 fL (ref 79–97)
MONOCYTES: 8 %
Monocytes Absolute: 0.6 10*3/uL (ref 0.1–0.9)
NEUTROS PCT: 62 %
Neutrophils Absolute: 4.5 10*3/uL (ref 1.4–7.0)
PLATELETS: 261 10*3/uL (ref 150–450)
RBC: 5.4 x10E6/uL (ref 4.14–5.80)
RDW: 13.7 % (ref 12.3–15.4)
WBC: 7.3 10*3/uL (ref 3.4–10.8)

## 2017-11-10 LAB — VITAMIN D 25 HYDROXY (VIT D DEFICIENCY, FRACTURES): Vit D, 25-Hydroxy: 18.4 ng/mL — ABNORMAL LOW (ref 30.0–100.0)

## 2017-11-10 MED ORDER — VITAMIN D (ERGOCALCIFEROL) 1.25 MG (50000 UNIT) PO CAPS: 50000.0000 [IU] | ORAL_CAPSULE | ORAL | 3 refills | Status: DC

## 2018-05-12 ENCOUNTER — Encounter: Payer: Self-pay | Admitting: Family Medicine

## 2018-05-12 ENCOUNTER — Ambulatory Visit: Payer: BC Managed Care – PPO | Admitting: Family Medicine

## 2018-05-12 VITALS — BP 134/81 | HR 96 | Temp 97.9°F | Ht 67.0 in | Wt 254.4 lb

## 2018-05-12 DIAGNOSIS — Z122 Encounter for screening for malignant neoplasm of respiratory organs: Secondary | ICD-10-CM

## 2018-05-12 DIAGNOSIS — M1712 Unilateral primary osteoarthritis, left knee: Secondary | ICD-10-CM

## 2018-05-12 DIAGNOSIS — I10 Essential (primary) hypertension: Secondary | ICD-10-CM

## 2018-05-12 DIAGNOSIS — Z23 Encounter for immunization: Secondary | ICD-10-CM | POA: Diagnosis not present

## 2018-05-12 DIAGNOSIS — F17200 Nicotine dependence, unspecified, uncomplicated: Secondary | ICD-10-CM

## 2018-05-12 MED ORDER — AMLODIPINE BESYLATE 5 MG PO TABS: 5.0000 mg | ORAL_TABLET | Freq: Every day | ORAL | 1 refills | Status: DC

## 2018-05-12 NOTE — Progress Notes (Signed)
Subjective:  Patient ID: Darrell Beard., male    DOB: [birth year]  Age: 68 y.o. MRN: [id number]  CC: Medical Management of Chronic Issues   HPI Darrell Beard. presents for  follow-up of hypertension. Patient has no history of headache chest pain or shortness of breath or recent cough. Patient also denies symptoms of TIA such as focal numbness or weakness. Patient denies side effects from medication. States taking it regularly.  He would also like to have his Tdap today.  Patient is a smoker.  He has been smoking more than a pack a day for 50 years, and has not had a low-dose CT.  Is agreeable to having that arranged.  Patient also continues to have pain in the left knee.  It is weak and sometimes will give way on him.  It is moderately severe and pain.  His wife has an orthopedist she like him to see.   History Darrell Beard has no past medical history on file.   He has a past surgical history that includes Appendectomy; Tonsillectomy; and Knee surgery (Bilateral).   His family history includes Cancer in his father.He reports that he has been smoking. He has a 35.00 pack-year smoking history. He has never used smokeless tobacco. He reports current alcohol use. He reports that he does not use drugs.  Current Outpatient Medications on File Prior to Visit  Medication Sig Dispense Refill  . Vitamin D, Ergocalciferol, (DRISDOL) 50000 units CAPS capsule Take 1 capsule (50,000 Units total) by mouth every 7 (seven) days. 13 capsule 3   No current facility-administered medications on file prior to visit.     ROS Review of Systems  Constitutional: Negative.   HENT: Negative.   Eyes: Negative for visual disturbance.  Respiratory: Negative for cough and shortness of breath.   Cardiovascular: Negative for chest pain and leg swelling.  Gastrointestinal: Negative for abdominal pain, diarrhea, nausea and vomiting.  Genitourinary: Negative for difficulty urinating.  Musculoskeletal: Positive for  arthralgias. Negative for myalgias.  Skin: Negative for rash.  Neurological: Negative for headaches.  Psychiatric/Behavioral: Negative for sleep disturbance.    Objective:  BP 134/81   Pulse 96   Temp 97.9 F (36.6 C) (Oral)   Ht 5' 7" (1.702 m)   Wt 254 lb 6 oz (115.4 kg)   BMI 39.84 kg/m   BP Readings from Last 3 Encounters:  05/12/18 134/81  11/09/17 138/80  08/24/17 (!) 144/79    Wt Readings from Last 3 Encounters:  05/12/18 254 lb 6 oz (115.4 kg)  11/09/17 250 lb 4 oz (113.5 kg)  08/24/17 251 lb 2 oz (113.9 kg)     Physical Exam Constitutional:      General: He is not in acute distress.    Appearance: He is well-developed.  HENT:     Head: Normocephalic and atraumatic.     Right Ear: External ear normal.     Left Ear: External ear normal.     Nose: Nose normal.  Eyes:     Conjunctiva/sclera: Conjunctivae normal.     Pupils: Pupils are equal, round, and reactive to light.  Neck:     Musculoskeletal: Normal range of motion and neck supple.  Cardiovascular:     Rate and Rhythm: Normal rate and regular rhythm.     Heart sounds: Normal heart sounds. No murmur.  Pulmonary:     Effort: Pulmonary effort is normal. No respiratory distress.     Breath sounds: Normal breath sounds. No wheezing or rales.  Abdominal:     Palpations: Abdomen is soft.     Tenderness: There is no abdominal tenderness.  Musculoskeletal: Normal range of motion.  Skin:    General: Skin is warm and dry.  Neurological:     Mental Status: He is alert and oriented to person, place, and time.     Deep Tendon Reflexes: Reflexes are normal and symmetric.  Psychiatric:        Behavior: Behavior normal.        Thought Content: Thought content normal.        Judgment: Judgment normal.       Assessment & Plan:   Momen was seen today for medical management of chronic issues.  Diagnoses and all orders for this visit:  Essential hypertension -     CMP14+EGFR  Arthritis of left knee -      Ambulatory referral to Orthopedics  Current smoker  Encounter for screening for malignant neoplasm of respiratory organs -     CT CHEST LUNG CA SCREEN LOW DOSE W/O CM; Future  Other orders -     amLODipine (NORVASC) 5 MG tablet; Take 1 tablet (5 mg total) by mouth daily. -     Tdap vaccine greater than or equal to 7yo IM   Allergies as of 05/12/2018      Reactions   Penicillins Anaphylaxis   Given when he was a child and told that he almost died from it      Medication List       Accurate as of May 12, 2018  1:53 PM. Always use your most recent med list.        amLODipine 5 MG tablet Commonly known as:  NORVASC Take 1 tablet (5 mg total) by mouth daily.   Vitamin D (Ergocalciferol) 1.25 MG (50000 UT) Caps capsule Commonly known as:  DRISDOL Take 1 capsule (50,000 Units total) by mouth every 7 (seven) days.       Meds ordered this encounter  Medications  . amLODipine (NORVASC) 5 MG tablet    Sig: Take 1 tablet (5 mg total) by mouth daily.    Dispense:  90 tablet    Refill:  1      Follow-up: Return in about 6 months (around 11/10/2018).  Claretta Fraise, M.D.

## 2018-05-13 LAB — CMP14+EGFR
ALK PHOS: 76 IU/L (ref 39–117)
ALT: 20 IU/L (ref 0–44)
AST: 16 IU/L (ref 0–40)
Albumin/Globulin Ratio: 1.6 (ref 1.2–2.2)
Albumin: 3.9 g/dL (ref 3.6–4.8)
BILIRUBIN TOTAL: 0.5 mg/dL (ref 0.0–1.2)
BUN/Creatinine Ratio: 14 (ref 10–24)
BUN: 16 mg/dL (ref 8–27)
CHLORIDE: 104 mmol/L (ref 96–106)
CO2: 23 mmol/L (ref 20–29)
Calcium: 9 mg/dL (ref 8.6–10.2)
Creatinine, Ser: 1.12 mg/dL (ref 0.76–1.27)
GFR calc non Af Amer: 68 mL/min/{1.73_m2} (ref >59)
GFR, EST AFRICAN AMERICAN: 79 mL/min/{1.73_m2} (ref >59)
Globulin, Total: 2.5 g/dL (ref 1.5–4.5)
Glucose: 106 mg/dL — ABNORMAL HIGH (ref 65–99)
Potassium: 4.6 mmol/L (ref 3.5–5.2)
Sodium: 142 mmol/L (ref 134–144)
TOTAL PROTEIN: 6.4 g/dL (ref 6.0–8.5)

## 2018-05-15 NOTE — Progress Notes (Signed)
Hello Jettson,  Your lab result is normal.Some minor variations that are not significant are commonly marked abnormal, but do not represent any medical problem for you.  Best regards, Kory Panjwani, M.D.

## 2018-05-18 ENCOUNTER — Other Ambulatory Visit: Payer: Self-pay | Admitting: Family Medicine

## 2018-05-18 DIAGNOSIS — F17200 Nicotine dependence, unspecified, uncomplicated: Secondary | ICD-10-CM

## 2018-05-25 ENCOUNTER — Ambulatory Visit (HOSPITAL_COMMUNITY): Payer: Self-pay

## 2018-05-26 ENCOUNTER — Ambulatory Visit (HOSPITAL_COMMUNITY): Payer: BC Managed Care – PPO

## 2018-05-31 ENCOUNTER — Ambulatory Visit (HOSPITAL_COMMUNITY)
Admission: RE | Admit: 2018-05-31 | Discharge: 2018-05-31 | Disposition: A | Discharge status: Home / Self Care | Payer: BC Managed Care – PPO | Source: Ambulatory Visit | Attending: Family Medicine | Admitting: Family Medicine

## 2018-05-31 DIAGNOSIS — F17200 Nicotine dependence, unspecified, uncomplicated: Secondary | ICD-10-CM | POA: Insufficient documentation

## 2018-10-23 ENCOUNTER — Other Ambulatory Visit: Payer: Self-pay | Admitting: Family Medicine

## 2018-11-13 ENCOUNTER — Encounter: Payer: Self-pay | Admitting: Family Medicine

## 2018-11-13 ENCOUNTER — Ambulatory Visit (INDEPENDENT_AMBULATORY_CARE_PROVIDER_SITE_OTHER): Payer: BC Managed Care – PPO | Admitting: Family Medicine

## 2018-11-13 DIAGNOSIS — I10 Essential (primary) hypertension: Secondary | ICD-10-CM

## 2018-11-13 DIAGNOSIS — M1712 Unilateral primary osteoarthritis, left knee: Secondary | ICD-10-CM

## 2018-11-13 DIAGNOSIS — E559 Vitamin D deficiency, unspecified: Secondary | ICD-10-CM | POA: Diagnosis not present

## 2018-11-13 MED ORDER — VITAMIN D (ERGOCALCIFEROL) 1.25 MG (50000 UNIT) PO CAPS: 50000.0000 [IU] | ORAL_CAPSULE | ORAL | 3 refills | Status: DC

## 2018-11-13 MED ORDER — AMLODIPINE BESYLATE 5 MG PO TABS: 5.0000 mg | ORAL_TABLET | Freq: Every day | ORAL | 1 refills | Status: DC

## 2018-11-13 NOTE — Progress Notes (Signed)
Subjective:  Patient ID: Darrell Graves., male    DOB: Jul 03, 1950  Age: 68 y.o. MRN: [id number]  CC: No chief complaint on file.   HPI Darrell Calk. presents for  follow-up of hypertension. Patient has no history of headache chest pain or shortness of breath or recent cough. Patient also denies symptoms of TIA such as focal numbness or weakness. Patient denies side effects from medication. States taking it regularly.   History Darrell Beard has no past medical history on file.   He has a past surgical history that includes Appendectomy; Tonsillectomy; and Knee surgery (Bilateral).   His family history includes Cancer in his father.He reports that he has been smoking. He has a 35.00 pack-year smoking history. He has never used smokeless tobacco. He reports current alcohol use. He reports that he does not use drugs.  No current outpatient medications on file prior to visit.   No current facility-administered medications on file prior to visit.     ROS Review of Systems  Constitutional: Negative.   HENT: Negative.   Eyes: Negative for visual disturbance.  Respiratory: Negative for cough and shortness of breath.   Cardiovascular: Negative for chest pain and leg swelling.  Gastrointestinal: Negative for abdominal pain, diarrhea, nausea and vomiting.  Genitourinary: Negative for difficulty urinating.  Musculoskeletal: Negative for myalgias. Arthralgias: left knee - had steroid shot that lasted 6 wks. Wants to delay repalcement until COVIID is over.  Skin: Negative for rash.  Neurological: Negative for headaches.  Psychiatric/Behavioral: Negative for sleep disturbance.    Objective:  There were no vitals taken for this visit.  BP Readings from Last 3 Encounters:  05/12/18 134/81  11/09/17 138/80  08/24/17 (!) 144/79    Wt Readings from Last 3 Encounters:  05/12/18 254 lb 6 oz (115.4 kg)  11/09/17 250 lb 4 oz (113.5 kg)  08/24/17 251 lb 2 oz (113.9 kg)     Physical Exam   Exam deferred. Pt. Harboring due to COVID 19. Phone visit performed.   Assessment & Plan:   Diagnoses and all orders for this visit:  Essential hypertension  Arthritis of left knee  Vitamin D deficiency  Other orders -     Vitamin D, Ergocalciferol, (DRISDOL) 1.25 MG (50000 UT) CAPS capsule; Take 1 capsule (50,000 Units total) by mouth every 7 (seven) days. -     amLODipine (NORVASC) 5 MG tablet; Take 1 tablet (5 mg total) by mouth daily.   Allergies as of 11/13/2018      Reactions   Penicillins Anaphylaxis   Given when he was a child and told that he almost died from it      Medication List       Accurate as of November 13, 2018  8:27 AM. If you have any questions, ask your nurse or doctor.        amLODipine 5 MG tablet Commonly known as: NORVASC Take 1 tablet (5 mg total) by mouth daily.   Vitamin D (Ergocalciferol) 1.25 MG (50000 UT) Caps capsule Commonly known as: DRISDOL Take 1 capsule (50,000 Units total) by mouth every 7 (seven) days.       Meds ordered this encounter  Medications  . Vitamin D, Ergocalciferol, (DRISDOL) 1.25 MG (50000 UT) CAPS capsule    Sig: Take 1 capsule (50,000 Units total) by mouth every 7 (seven) days.    Dispense:  13 capsule    Refill:  3  . amLODipine (NORVASC) 5 MG tablet    Sig: Take  1 tablet (5 mg total) by mouth daily.    Dispense:  90 tablet    Refill:  1    Virtual Visit via telephone Note  I discussed the limitations, risks, security and privacy concerns of performing an evaluation and management service by telephone and the availability of in person appointments. I also discussed with the patient that there may be a patient responsible charge related to this service. The patient expressed understanding and agreed to proceed. Pt. Is at home. Dr. Livia Snellen is in his office.  Follow Up Instructions:   I discussed the assessment and treatment plan with the patient. The patient was provided an opportunity to ask questions and  all were answered. The patient agreed with the plan and demonstrated an understanding of the instructions.   The patient was advised to call back or seek an in-person evaluation if the symptoms worsen or if the condition fails to improve as anticipated.  \Total minutes including chart review and phone contact time: 17   Follow-up: Return in about 6 months (around 05/16/2019) for CPE.  Claretta Fraise, M.D.

## 2019-05-15 ENCOUNTER — Other Ambulatory Visit: Payer: Self-pay

## 2019-05-16 ENCOUNTER — Encounter: Payer: Self-pay | Admitting: Family Medicine

## 2019-05-16 ENCOUNTER — Ambulatory Visit: Payer: BC Managed Care – PPO | Admitting: Family Medicine

## 2019-05-16 VITALS — BP 135/83 | HR 92 | Temp 98.5°F | Ht 67.0 in | Wt 248.8 lb

## 2019-05-16 DIAGNOSIS — M1712 Unilateral primary osteoarthritis, left knee: Secondary | ICD-10-CM

## 2019-05-16 DIAGNOSIS — I10 Essential (primary) hypertension: Secondary | ICD-10-CM | POA: Diagnosis not present

## 2019-05-16 DIAGNOSIS — Z125 Encounter for screening for malignant neoplasm of prostate: Secondary | ICD-10-CM

## 2019-05-16 DIAGNOSIS — E559 Vitamin D deficiency, unspecified: Secondary | ICD-10-CM

## 2019-05-16 DIAGNOSIS — Z1211 Encounter for screening for malignant neoplasm of colon: Secondary | ICD-10-CM

## 2019-05-16 DIAGNOSIS — Z23 Encounter for immunization: Secondary | ICD-10-CM

## 2019-05-16 DIAGNOSIS — Z0000 Encounter for general adult medical examination without abnormal findings: Secondary | ICD-10-CM | POA: Diagnosis not present

## 2019-05-16 LAB — URINALYSIS
Bilirubin, UA: NEGATIVE
Glucose, UA: NEGATIVE
Ketones, UA: NEGATIVE
Leukocytes,UA: NEGATIVE
Nitrite, UA: NEGATIVE
Protein,UA: NEGATIVE
Specific Gravity, UA: 1.025 (ref 1.005–1.030)
Urobilinogen, Ur: 0.2 mg/dL (ref 0.2–1.0)
pH, UA: 5.5 (ref 5.0–7.5)

## 2019-05-16 MED ORDER — AMLODIPINE BESYLATE 5 MG PO TABS: 5.0000 mg | ORAL_TABLET | Freq: Every day | ORAL | 1 refills | Status: DC

## 2019-05-16 NOTE — Progress Notes (Addendum)
Subjective:  Patient ID: Darrell Beard., male    DOB: Aug 22, 1950  Age: 69 y.o. MRN: [id number]  CC: Annual Exam   HPI Atthew Mundinger. presents for annual CPE.    presents for  follow-up of hypertension. Patient has no history of headache chest pain or shortness of breath or recent cough. Patient also denies symptoms of TIA such as focal numbness or weakness. Patient denies side effects from medication. States taking it regularly.  Also arthritis of knee.  Pain is mitigated by over-the-counter Voltaren cream.  He is planning arthroscopic surgery for this in the near future.  It is been delayed by the Covid pandemic from last year.  He remains nervous about that.  We discussed the pros and cons of using the vaccine.  Depression screen [facility] 2/9 05/16/2019 05/12/2018 11/09/2017  Decreased Interest 0 0 0  Down, Depressed, Hopeless 0 0 0  PHQ - 2 Score 0 0 0    History Adekunle has no past medical history on file.   He has a past surgical history that includes Appendectomy; Tonsillectomy; and Knee surgery (Bilateral).   His family history includes Cancer in his father.He reports that he has been smoking. He has a 35.00 pack-year smoking history. He has never used smokeless tobacco. He reports current alcohol use. He reports that he does not use drugs.    ROS Review of Systems  Constitutional: Negative for activity change, fatigue and unexpected weight change.  HENT: Negative for congestion, ear pain, hearing loss, postnasal drip and trouble swallowing.   Eyes: Negative for pain and visual disturbance.  Respiratory: Negative for cough, chest tightness and shortness of breath.   Cardiovascular: Negative for chest pain, palpitations and leg swelling.  Gastrointestinal: Negative for abdominal distention, abdominal pain, blood in stool, constipation, diarrhea, nausea and vomiting.  Endocrine: Negative for cold intolerance, heat intolerance and polydipsia.  Genitourinary: Negative for  difficulty urinating, dysuria, flank pain, frequency and urgency.  Musculoskeletal: Positive for arthralgias. Negative for joint swelling.  Skin: Negative for color change, rash and wound.  Neurological: Negative for dizziness, syncope, speech difficulty, weakness, light-headedness, numbness and headaches.  Hematological: Does not bruise/bleed easily.  Psychiatric/Behavioral: Negative for confusion, decreased concentration, dysphoric mood and sleep disturbance. The patient is not nervous/anxious.     Objective:  BP 135/83   Pulse 92   Temp 98.5 F (36.9 C) (Temporal)   Ht 5\' 7"  (1.702 m)   Wt 248 lb 12.8 oz (112.9 kg)   BMI 38.97 kg/m   BP Readings from Last 3 Encounters:  05/16/19 135/83  05/12/18 134/81  11/09/17 138/80    Wt Readings from Last 3 Encounters:  05/16/19 248 lb 12.8 oz (112.9 kg)  05/12/18 254 lb 6 oz (115.4 kg)  11/09/17 250 lb 4 oz (113.5 kg)     Physical Exam Constitutional:      Appearance: He is well-developed.  HENT:     Head: Normocephalic and atraumatic.  Eyes:     Pupils: Pupils are equal, round, and reactive to light.  Neck:     Thyroid: No thyromegaly.     Trachea: No tracheal deviation.  Cardiovascular:     Rate and Rhythm: Normal rate and regular rhythm.     Heart sounds: Normal heart sounds. No murmur. No friction rub. No gallop.   Pulmonary:     Breath sounds: Normal breath sounds. No wheezing or rales.  Abdominal:     General: Bowel sounds are normal. There is no distension.  Palpations: Abdomen is soft. There is no mass.     Tenderness: There is no abdominal tenderness.     Hernia: There is no hernia in the left inguinal area.  Genitourinary:    Penis: Normal.      Testes: Normal.  Musculoskeletal:        General: Normal range of motion.     Cervical back: Normal range of motion.  Lymphadenopathy:     Cervical: No cervical adenopathy.  Skin:    General: Skin is warm and dry.  Neurological:     Mental Status: He is alert  and oriented to person, place, and time.       Assessment & Plan:   Tyion was seen today for annual exam.  Diagnoses and all orders for this visit:  Vitamin D deficiency -     VITAMIN D  Arthritis of left knee -     CBC -     CMP  Essential hypertension -     CBC -     CMP -     Lipid -     Urinalysis- dip only -     amLODipine (NORVASC) 5 MG tablet; Take 1 tablet (5 mg total) by mouth daily.  Well adult exam -     CBC -     CMP -     Lipid -     PSA Total (Reflex To Free) -     VITAMIN D -     Urinalysis- dip only  Screening for prostate cancer -     PSA Total (Reflex To Free)  Screen for colon cancer -     Colonoscopy  Other orders -     Pneumococcal conjugate vaccine 13-valent       I am having Darrell Beard. maintain his Vitamin D (Ergocalciferol) and amLODipine.  Allergies as of 05/16/2019      Reactions   Penicillins Anaphylaxis   Given when he was a child and told that he almost died from it      Medication List       Accurate as of May 16, 2019  7:08 PM. If you have any questions, ask your nurse or doctor.        amLODipine 5 MG tablet Commonly known as: NORVASC Take 1 tablet (5 mg total) by mouth daily.   Vitamin D (Ergocalciferol) 1.25 MG (50000 UNIT) Caps capsule Commonly known as: DRISDOL Take 1 capsule (50,000 Units total) by mouth every 7 (seven) days.        Follow-up: Return in about 6 months (around 11/13/2019).  Claretta Fraise, M.D.

## 2019-05-17 LAB — PSA TOTAL (REFLEX TO FREE): Prostate Specific Ag, Serum: 10 ng/mL — ABNORMAL HIGH (ref 0.0–4.0)

## 2019-05-17 LAB — LIPID PANEL
Chol/HDL Ratio: 3.4 ratio (ref 0.0–5.0)
Cholesterol, Total: 165 mg/dL (ref 100–199)
HDL: 49 mg/dL (ref >39)
LDL Chol Calc (NIH): 102 mg/dL — ABNORMAL HIGH (ref 0–99)
Triglycerides: 71 mg/dL (ref 0–149)
VLDL Cholesterol Cal: 14 mg/dL (ref 5–40)

## 2019-05-17 LAB — CMP14+EGFR
ALT: 19 IU/L (ref 0–44)
AST: 16 IU/L (ref 0–40)
Albumin/Globulin Ratio: 1.8 (ref 1.2–2.2)
Albumin: 4.4 g/dL (ref 3.8–4.8)
Alkaline Phosphatase: 101 IU/L (ref 39–117)
BUN/Creatinine Ratio: 13 (ref 10–24)
BUN: 16 mg/dL (ref 8–27)
Bilirubin Total: 0.6 mg/dL (ref 0.0–1.2)
CO2: 26 mmol/L (ref 20–29)
Calcium: 9.6 mg/dL (ref 8.6–10.2)
Chloride: 98 mmol/L (ref 96–106)
Creatinine, Ser: 1.27 mg/dL (ref 0.76–1.27)
GFR calc Af Amer: 67 mL/min/{1.73_m2} (ref >59)
GFR calc non Af Amer: 58 mL/min/{1.73_m2} — ABNORMAL LOW (ref >59)
Globulin, Total: 2.5 g/dL (ref 1.5–4.5)
Glucose: 106 mg/dL — ABNORMAL HIGH (ref 65–99)
Potassium: 4.7 mmol/L (ref 3.5–5.2)
Sodium: 138 mmol/L (ref 134–144)
Total Protein: 6.9 g/dL (ref 6.0–8.5)

## 2019-05-17 LAB — CBC WITH DIFFERENTIAL/PLATELET
Basophils Absolute: 0.1 10*3/uL (ref 0.0–0.2)
Basos: 1 %
EOS (ABSOLUTE): 0.1 10*3/uL (ref 0.0–0.4)
Eos: 2 %
Hematocrit: 53.2 % — ABNORMAL HIGH (ref 37.5–51.0)
Hemoglobin: 17.9 g/dL — ABNORMAL HIGH (ref 13.0–17.7)
Immature Grans (Abs): 0.1 10*3/uL (ref 0.0–0.1)
Immature Granulocytes: 1 %
Lymphocytes Absolute: 2.5 10*3/uL (ref 0.7–3.1)
Lymphs: 33 %
MCH: 30.7 pg (ref 26.6–33.0)
MCHC: 33.6 g/dL (ref 31.5–35.7)
MCV: 91 fL (ref 79–97)
Monocytes Absolute: 0.6 10*3/uL (ref 0.1–0.9)
Monocytes: 8 %
Neutrophils Absolute: 4.3 10*3/uL (ref 1.4–7.0)
Neutrophils: 55 %
Platelets: 305 10*3/uL (ref 150–450)
RBC: 5.83 x10E6/uL — ABNORMAL HIGH (ref 4.14–5.80)
RDW: 12.8 % (ref 11.6–15.4)
WBC: 7.7 10*3/uL (ref 3.4–10.8)

## 2019-05-17 LAB — VITAMIN D 25 HYDROXY (VIT D DEFICIENCY, FRACTURES): Vit D, 25-Hydroxy: 44.1 ng/mL (ref 30.0–100.0)

## 2019-05-17 LAB — FPSA% REFLEX
% FREE PSA: 8.6 %
PSA, FREE: 0.86 ng/mL

## 2019-05-19 ENCOUNTER — Other Ambulatory Visit: Payer: Self-pay | Admitting: Family Medicine

## 2019-05-19 DIAGNOSIS — R972 Elevated prostate specific antigen [PSA]: Secondary | ICD-10-CM

## 2019-05-29 ENCOUNTER — Other Ambulatory Visit: Payer: Self-pay | Admitting: Nurse Practitioner

## 2019-05-29 DIAGNOSIS — R972 Elevated prostate specific antigen [PSA]: Secondary | ICD-10-CM

## 2019-05-29 DIAGNOSIS — Z77018 Contact with and (suspected) exposure to other hazardous metals: Secondary | ICD-10-CM

## 2019-06-22 ENCOUNTER — Ambulatory Visit
Admission: RE | Admit: 2019-06-22 | Discharge: 2019-06-22 | Disposition: A | Discharge status: Home / Self Care | Payer: BC Managed Care – PPO | Source: Ambulatory Visit | Attending: Nurse Practitioner | Admitting: Nurse Practitioner

## 2019-06-22 ENCOUNTER — Other Ambulatory Visit: Payer: Self-pay

## 2019-06-22 DIAGNOSIS — R972 Elevated prostate specific antigen [PSA]: Secondary | ICD-10-CM

## 2019-06-22 DIAGNOSIS — Z77018 Contact with and (suspected) exposure to other hazardous metals: Secondary | ICD-10-CM

## 2019-06-22 MED ORDER — GADOBENATE DIMEGLUMINE 529 MG/ML IV SOLN
20.0000 mL | Freq: Once | INTRAVENOUS | Status: AC | PRN
  Administered 2019-06-22: 20 mL via INTRAVENOUS

## 2019-07-23 ENCOUNTER — Encounter: Payer: Self-pay | Admitting: Family Medicine

## 2019-10-02 IMAGING — CT CT CHEST LUNG CANCER SCREENING LOW DOSE W/O CM
1 series · 10 of 10 positions shown, 13 images · non-contrast
Comparison: CT chest dated 08/12/2005

CLINICAL DATA: 67-year-old male current smoker, with 74 pack-year
history of smoking, for initial lung cancer screening

EXAM:
CT CHEST WITHOUT CONTRAST LOW-DOSE FOR LUNG CANCER SCREENING
TECHNIQUE: Multidetector CT imaging of the chest was performed following the
standard protocol without IV contrast.

[ct lung segmentation data · axial · 0.79mm/px · z∈[+1413,+1413]mm · 10 of 366 frames shown]
[frame 1/366  mediastinal]
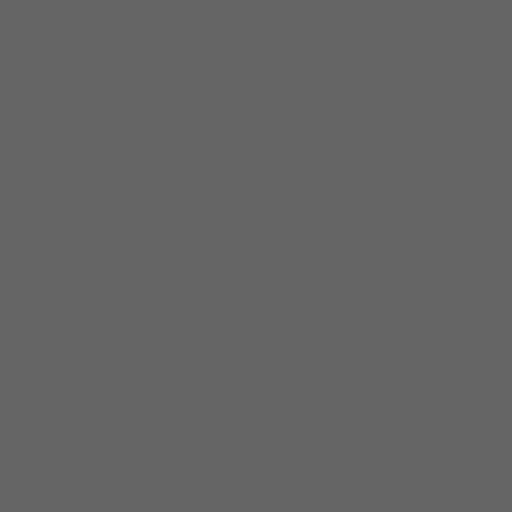
[frame 1/366  lung]
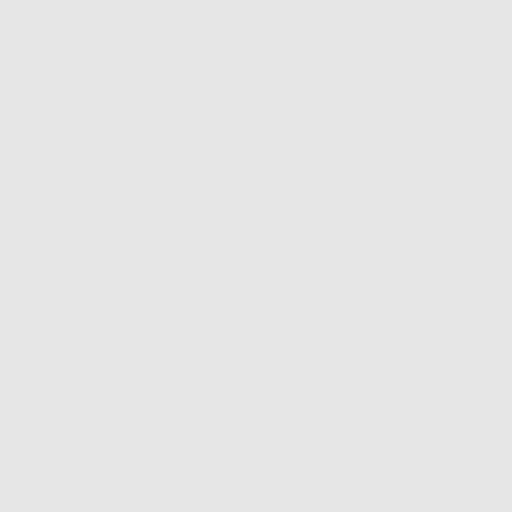
[frame 41/366  lung]
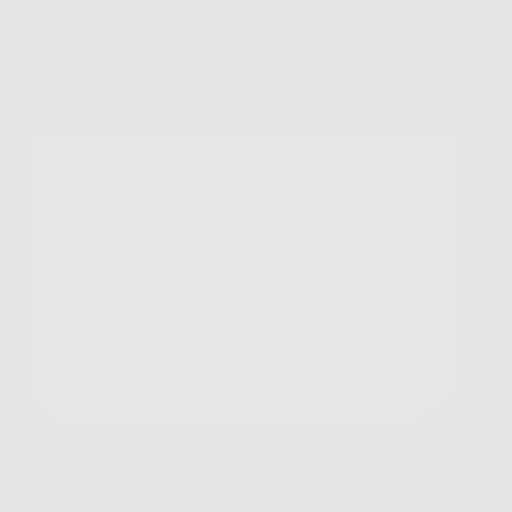
[frame 82/366  lung]
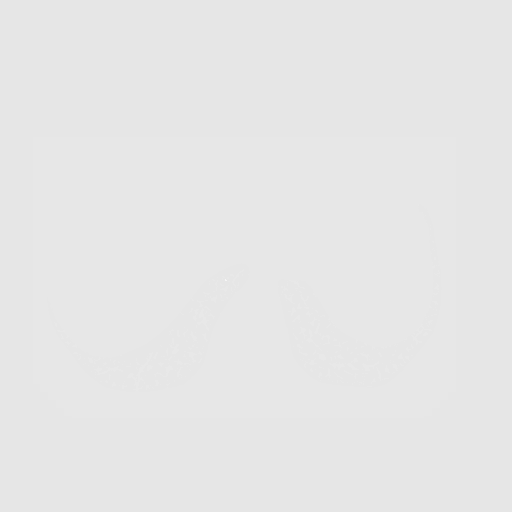
[frame 122/366  lung]
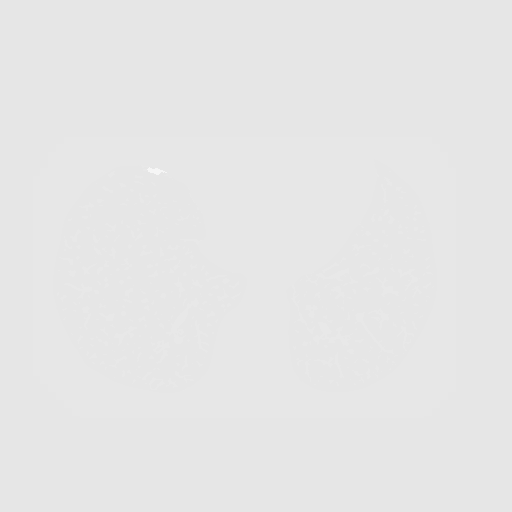
[frame 163/366  mediastinal]
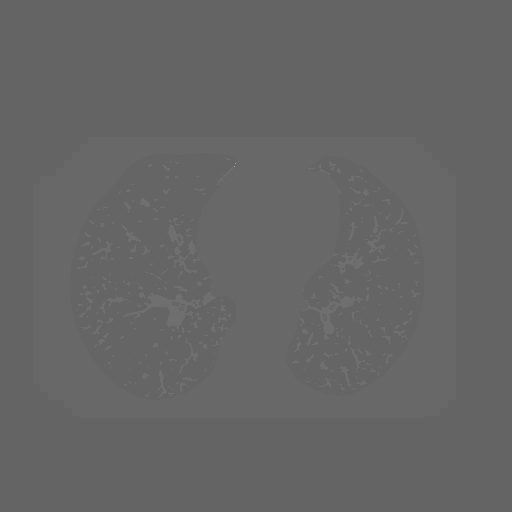
[frame 163/366  lung]
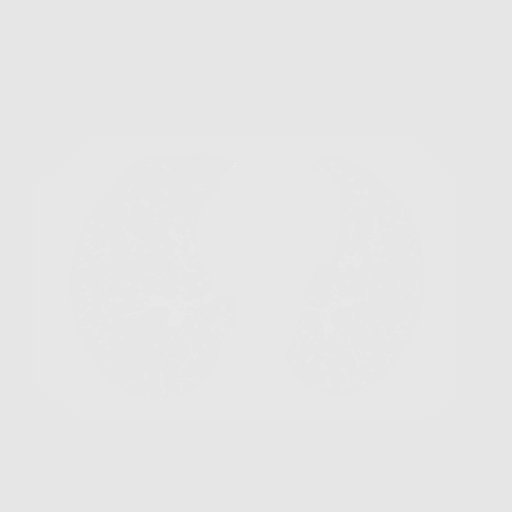
[frame 203/366  lung]
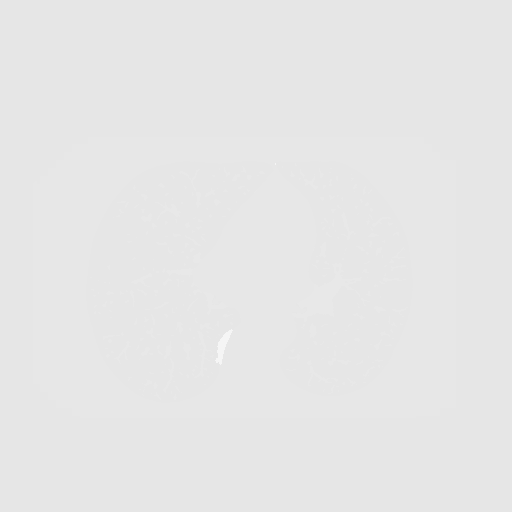
[frame 244/366  lung]
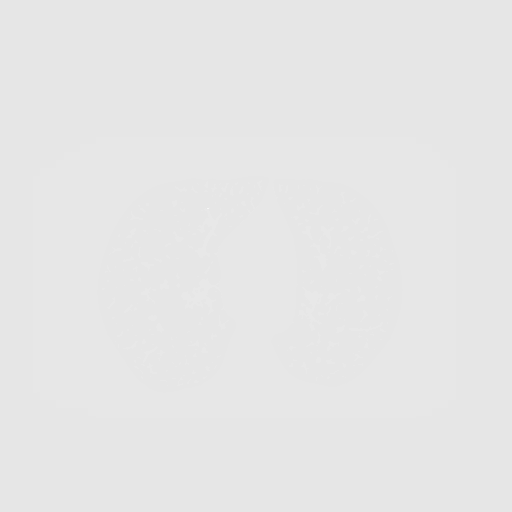
[frame 284/366  lung]
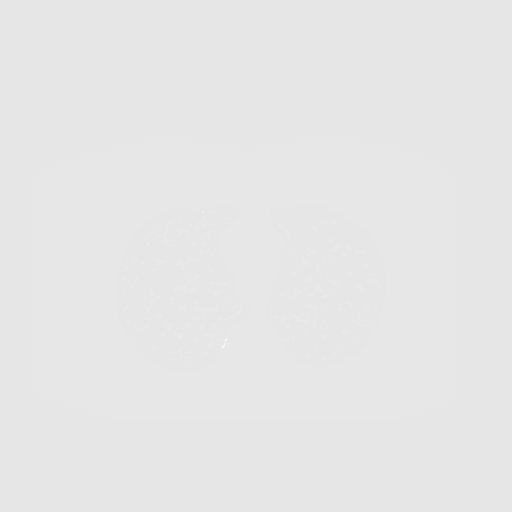
[frame 325/366  mediastinal]
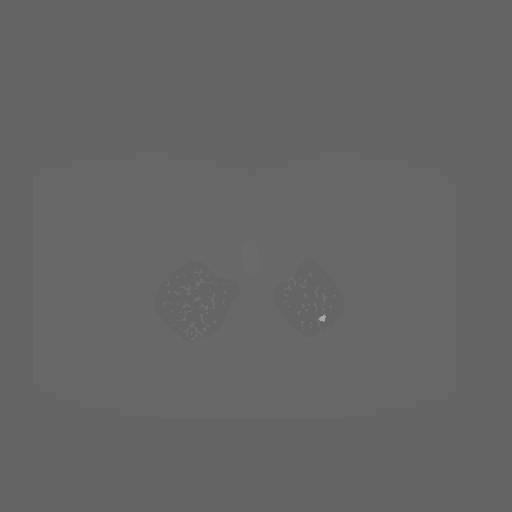
[frame 325/366  lung]
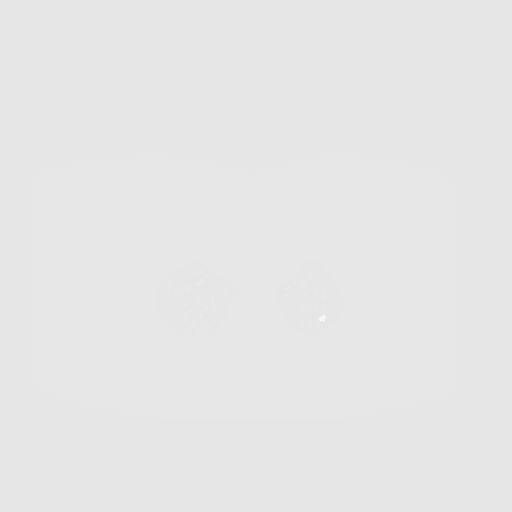
[frame 366/366  lung]
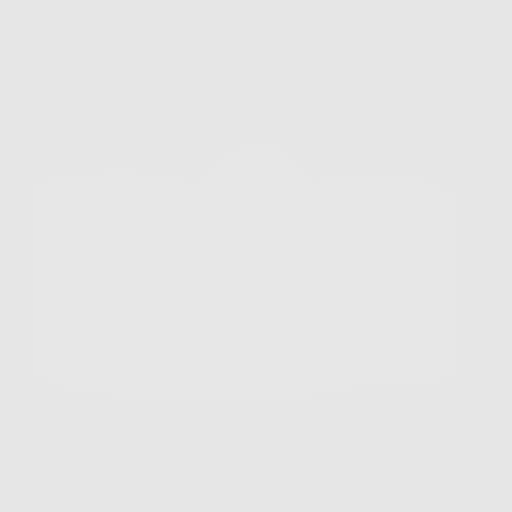

[10 of 10 positions shown; findings below may reference images not displayed]

FINDINGS: Cardiovascular: Heart is normal in size. No pericardial effusion.
Lipomatosis hypertrophy of the interatrial septum.

No evidence/aortic aneurysm. Mild atherosclerotic calcifications of
the aortic arch.

Mediastinum/Nodes: No suspicious mediastinal lymphadenopathy.

Visualized thyroid is unremarkable.

Lungs/Pleura: Mild left apical pleural-parenchymal scarring.

Mild centrilobular emphysematous changes, upper lobe predominant.

No focal consolidation.

8.9 mm nodule in the central right upper lobe (image 115), partially
calcified, previously 4 mm. The slow growth and calcification
pattern favor a benign etiology.

Additional 5.7 mm calcified granuloma in the medial right lower
lobe, benign.

No pleural effusion or pneumothorax.

Upper Abdomen: Visualized upper abdomen is notable for a benign left
adrenal adenoma and cholelithiasis with chronic wall calcification
of the gallbladder.

Musculoskeletal: Degenerative changes of the visualized
thoracolumbar spine.
IMPRESSION: Lung-RADS 3, probably benign findings. Short-term follow-up in 6
months is recommended with repeat low-dose chest CT without contrast
(please use the following order, "CT CHEST LCS NODULE FOLLOW-UP W/O
CM").

8.9 mm nodule in the central right upper lobe, manually downgraded
to Lung Rads 3 due to slow growth from 0775 as well as a benign
calcification pattern.

Aortic Atherosclerosis (R0AWT-7GY.Y) and Emphysema (R0AWT-8NO.W).

## 2019-11-28 ENCOUNTER — Other Ambulatory Visit: Payer: Self-pay | Admitting: Family Medicine

## 2019-12-11 ENCOUNTER — Ambulatory Visit (INDEPENDENT_AMBULATORY_CARE_PROVIDER_SITE_OTHER): Payer: BC Managed Care – PPO | Admitting: Family Medicine

## 2019-12-11 ENCOUNTER — Other Ambulatory Visit: Payer: Self-pay

## 2019-12-11 ENCOUNTER — Encounter: Payer: Self-pay | Admitting: Family Medicine

## 2019-12-11 VITALS — BP 127/83 | HR 89 | Temp 98.2°F | Resp 18 | Ht 67.0 in | Wt 240.4 lb

## 2019-12-11 DIAGNOSIS — E559 Vitamin D deficiency, unspecified: Secondary | ICD-10-CM | POA: Diagnosis not present

## 2019-12-11 DIAGNOSIS — M1712 Unilateral primary osteoarthritis, left knee: Secondary | ICD-10-CM

## 2019-12-11 DIAGNOSIS — I10 Essential (primary) hypertension: Secondary | ICD-10-CM | POA: Diagnosis not present

## 2019-12-11 DIAGNOSIS — Z01818 Encounter for other preprocedural examination: Secondary | ICD-10-CM | POA: Diagnosis not present

## 2019-12-11 MED ORDER — VITAMIN D (ERGOCALCIFEROL) 1.25 MG (50000 UNIT) PO CAPS: 50000.0000 [IU] | ORAL_CAPSULE | ORAL | 3 refills | Status: DC

## 2019-12-11 NOTE — Progress Notes (Signed)
Subjective:  Patient ID: Darrell Beard., male    DOB: June 06, 1950  Age: 69 y.o. MRN: [id number]  CC: Surgical Clearance   HPI Darrell Beard. presents for surgical clearance.  Depression screen [facility] 2/9 12/11/2019 05/16/2019 05/12/2018  Decreased Interest 0 0 0  Down, Depressed, Hopeless 0 0 0  PHQ - 2 Score 0 0 0    History Darrell Beard has no past medical history on file.   He has a past surgical history that includes Appendectomy; Tonsillectomy; and Knee surgery (Bilateral).   His family history includes Cancer in his father.He reports that he has been smoking. He has a 35.00 pack-year smoking history. He has never used smokeless tobacco. He reports current alcohol use. He reports that he does not use drugs.    ROS Review of Systems  Constitutional: Negative.   HENT: Negative.   Eyes: Negative for visual disturbance.  Respiratory: Negative for cough and shortness of breath.   Cardiovascular: Negative for chest pain and leg swelling.  Gastrointestinal: Negative for abdominal pain, diarrhea, nausea and vomiting.  Genitourinary: Negative for difficulty urinating.  Musculoskeletal: Negative for arthralgias and myalgias.  Skin: Negative for rash.  Neurological: Negative for headaches.  Psychiatric/Behavioral: Negative for sleep disturbance.    Objective:  BP 127/83   Pulse 89   Temp 98.2 F (36.8 C) (Temporal)   Resp 18   Ht '5\' 7"'  (1.702 m)   Wt 240 lb 6.4 oz (109 kg)   SpO2 97%   BMI 37.65 kg/m   BP Readings from Last 3 Encounters:  12/11/19 127/83  05/16/19 135/83  05/12/18 134/81    Wt Readings from Last 3 Encounters:  12/11/19 240 lb 6.4 oz (109 kg)  05/16/19 248 lb 12.8 oz (112.9 kg)  05/12/18 254 lb 6 oz (115.4 kg)     Physical Exam Vitals reviewed.  Constitutional:      Appearance: He is well-developed.  HENT:     Head: Normocephalic and atraumatic.     Right Ear: Tympanic membrane and external ear normal. No decreased hearing noted.     Left Ear:  Tympanic membrane and external ear normal. No decreased hearing noted.     Mouth/Throat:     Pharynx: No oropharyngeal exudate or posterior oropharyngeal erythema.  Eyes:     Pupils: Pupils are equal, round, and reactive to light.  Cardiovascular:     Rate and Rhythm: Normal rate and regular rhythm.     Heart sounds: No murmur heard.   Pulmonary:     Effort: No respiratory distress.     Breath sounds: Normal breath sounds.  Abdominal:     General: Bowel sounds are normal.     Palpations: Abdomen is soft. There is no mass.     Tenderness: There is no abdominal tenderness.  Musculoskeletal:     Cervical back: Normal range of motion and neck supple.       Assessment & Plan:   Darrell Beard was seen today for surgical clearance.  Diagnoses and all orders for this visit:  Pre-operative clearance -     EKG 12-Lead -     CBC with Differential/Platelet -     CMP14+EGFR  Vitamin D deficiency -     CBC with Differential/Platelet -     CMP14+EGFR -     VITAMIN D 25 Hydroxy (Vit-D Deficiency, Fractures)  Arthritis of left knee -     CBC with Differential/Platelet -     CMP14+EGFR  Essential hypertension -  CBC with Differential/Platelet -     CMP14+EGFR  Other orders -     Vitamin D, Ergocalciferol, (DRISDOL) 1.25 MG (50000 UNIT) CAPS capsule; Take 1 capsule (50,000 Units total) by mouth once a week.       I have changed Darrell Alcide Jr.'s Vitamin D (Ergocalciferol). I am also having him maintain his amLODipine.  Allergies as of 12/11/2019      Reactions   Penicillins Anaphylaxis   Given when he was a child and told that he almost died from it      Medication List       Accurate as of December 11, 2019 11:59 PM. If you have any questions, ask your nurse or doctor.        amLODipine 5 MG tablet Commonly known as: NORVASC Take 1 tablet (5 mg total) by mouth daily.   Vitamin D (Ergocalciferol) 1.25 MG (50000 UNIT) Caps capsule Commonly known as: DRISDOL Take 1  capsule (50,000 Units total) by mouth once a week.        Follow-up: Return in about 6 months (around 06/12/2020).  Darrell Beard, M.D.

## 2019-12-12 LAB — CMP14+EGFR
ALT: 18 IU/L (ref 0–44)
AST: 16 IU/L (ref 0–40)
Albumin/Globulin Ratio: 2.1 (ref 1.2–2.2)
Albumin: 4.2 g/dL (ref 3.8–4.8)
Alkaline Phosphatase: 82 IU/L (ref 48–121)
BUN/Creatinine Ratio: 16 (ref 10–24)
BUN: 19 mg/dL (ref 8–27)
Bilirubin Total: 0.4 mg/dL (ref 0.0–1.2)
CO2: 24 mmol/L (ref 20–29)
Calcium: 9.2 mg/dL (ref 8.6–10.2)
Chloride: 103 mmol/L (ref 96–106)
Creatinine, Ser: 1.2 mg/dL (ref 0.76–1.27)
GFR calc Af Amer: 72 mL/min/{1.73_m2} (ref >59)
GFR calc non Af Amer: 62 mL/min/{1.73_m2} (ref >59)
Globulin, Total: 2 g/dL (ref 1.5–4.5)
Glucose: 99 mg/dL (ref 65–99)
Potassium: 4.3 mmol/L (ref 3.5–5.2)
Sodium: 141 mmol/L (ref 134–144)
Total Protein: 6.2 g/dL (ref 6.0–8.5)

## 2019-12-12 LAB — CBC WITH DIFFERENTIAL/PLATELET
Basophils Absolute: 0.1 10*3/uL (ref 0.0–0.2)
Basos: 1 %
EOS (ABSOLUTE): 0.2 10*3/uL (ref 0.0–0.4)
Eos: 2 %
Hematocrit: 50.2 % (ref 37.5–51.0)
Hemoglobin: 17.1 g/dL (ref 13.0–17.7)
Immature Grans (Abs): 0.1 10*3/uL (ref 0.0–0.1)
Immature Granulocytes: 1 %
Lymphocytes Absolute: 2.8 10*3/uL (ref 0.7–3.1)
Lymphs: 28 %
MCH: 31.5 pg (ref 26.6–33.0)
MCHC: 34.1 g/dL (ref 31.5–35.7)
MCV: 93 fL (ref 79–97)
Monocytes Absolute: 0.9 10*3/uL (ref 0.1–0.9)
Monocytes: 9 %
Neutrophils Absolute: 6.1 10*3/uL (ref 1.4–7.0)
Neutrophils: 59 %
Platelets: 265 10*3/uL (ref 150–450)
RBC: 5.42 x10E6/uL (ref 4.14–5.80)
RDW: 12.4 % (ref 11.6–15.4)
WBC: 10.2 10*3/uL (ref 3.4–10.8)

## 2019-12-12 LAB — VITAMIN D 25 HYDROXY (VIT D DEFICIENCY, FRACTURES): Vit D, 25-Hydroxy: 45.8 ng/mL (ref 30.0–100.0)

## 2019-12-12 NOTE — Progress Notes (Signed)
Hello Darrell Beard,  Your lab result is normal and/or stable.Some minor variations that are not significant are commonly marked abnormal, but do not represent any medical problem for you.  Best regards, Alika Saladin, M.D.

## 2019-12-19 ENCOUNTER — Telehealth: Payer: Self-pay | Admitting: Family Medicine

## 2019-12-21 NOTE — Telephone Encounter (Signed)
Faxed records

## 2019-12-22 ENCOUNTER — Encounter: Payer: Self-pay | Admitting: Family Medicine

## 2020-03-24 ENCOUNTER — Other Ambulatory Visit: Payer: Self-pay | Admitting: Family Medicine

## 2020-03-24 DIAGNOSIS — I10 Essential (primary) hypertension: Secondary | ICD-10-CM

## 2020-07-09 ENCOUNTER — Other Ambulatory Visit: Payer: Self-pay

## 2020-07-09 ENCOUNTER — Ambulatory Visit: Payer: BC Managed Care – PPO | Admitting: Family Medicine

## 2020-07-09 ENCOUNTER — Encounter: Payer: Self-pay | Admitting: Family Medicine

## 2020-07-09 VITALS — BP 135/77 | HR 92 | Temp 98.3°F | Ht 67.0 in | Wt 241.0 lb

## 2020-07-09 DIAGNOSIS — Z23 Encounter for immunization: Secondary | ICD-10-CM

## 2020-07-09 DIAGNOSIS — I10 Essential (primary) hypertension: Secondary | ICD-10-CM | POA: Diagnosis not present

## 2020-07-09 DIAGNOSIS — Z125 Encounter for screening for malignant neoplasm of prostate: Secondary | ICD-10-CM

## 2020-07-09 MED ORDER — CLOTRIMAZOLE-BETAMETHASONE 1-0.05 % EX CREA: 1.0000 "application " | TOPICAL_CREAM | Freq: Two times a day (BID) | CUTANEOUS | 1 refills | Status: DC

## 2020-07-09 MED ORDER — AMLODIPINE BESYLATE 5 MG PO TABS: 5.0000 mg | ORAL_TABLET | Freq: Every day | ORAL | 0 refills | Status: DC

## 2020-07-09 MED ORDER — VITAMIN D (ERGOCALCIFEROL) 1.25 MG (50000 UNIT) PO CAPS: 50000.0000 [IU] | ORAL_CAPSULE | ORAL | 3 refills | Status: DC

## 2020-07-09 MED ORDER — AMLODIPINE BESYLATE 5 MG PO TABS: 5.0000 mg | ORAL_TABLET | Freq: Every day | ORAL | 3 refills | Status: DC

## 2020-07-09 NOTE — Progress Notes (Signed)
Subjective:  Patient ID: Darrell Graves., male    DOB: 1950/10/08  Age: 70 y.o. MRN: [id number]  CC: Medication Refill   HPI Darrell Beard. presents for  follow-up of hypertension. Patient has no history of headache chest pain or shortness of breath or recent cough. Patient also denies symptoms of TIA such as focal numbness or weakness. Patient denies side effects from medication. States taking it regularly.  History Darrell Beard has no past medical history on file.   He has a past surgical history that includes Appendectomy; Tonsillectomy; Knee surgery (Bilateral); and Joint replacement.   His family history includes Cancer in his father.He reports that he has been smoking. He has a 35.00 pack-year smoking history. He has never used smokeless tobacco. He reports current alcohol use. He reports that he does not use drugs.  No current outpatient medications on file prior to visit.   No current facility-administered medications on file prior to visit.    ROS Review of Systems  Constitutional: Negative for fever.  Respiratory: Negative for shortness of breath.   Cardiovascular: Negative for chest pain.  Musculoskeletal: Negative for arthralgias.  Skin: Negative for rash.    Objective:  BP 135/77   Pulse 92   Temp 98.3 F (36.8 C)   Ht _0  (1.702 m)   Wt 241 lb (109.3 kg)   SpO2 96%   BMI 37.75 kg/m   BP Readings from Last 3 Encounters:  07/09/20 135/77  12/11/19 127/83  05/16/19 135/83    Wt Readings from Last 3 Encounters:  07/09/20 241 lb (109.3 kg)  12/11/19 240 lb 6.4 oz (109 kg)  05/16/19 248 lb 12.8 oz (112.9 kg)     Physical Exam Vitals reviewed.  Constitutional:      Appearance: He is well-developed and well-nourished.  HENT:     Head: Normocephalic and atraumatic.     Right Ear: External ear normal.     Left Ear: External ear normal.     Mouth/Throat:     Pharynx: No oropharyngeal exudate or posterior oropharyngeal erythema.  Eyes:     Pupils:  Pupils are equal, round, and reactive to light.  Cardiovascular:     Rate and Rhythm: Normal rate and regular rhythm.     Heart sounds: No murmur heard.   Pulmonary:     Effort: No respiratory distress.     Breath sounds: Normal breath sounds.  Musculoskeletal:     Cervical back: Normal range of motion and neck supple.  Neurological:     Mental Status: He is alert and oriented to person, place, and time.       Assessment & Plan:   Darrell Beard was seen today for medication refill.  Diagnoses and all orders for this visit:  Screening for prostate cancer -     PSA, total and free  Essential hypertension -     Discontinue: amLODipine (NORVASC) 5 MG tablet; Take 1 tablet (5 mg total) by mouth daily. -     CBC with Differential/Platelet -     CMP14+EGFR -     amLODipine (NORVASC) 5 MG tablet; Take 1 tablet (5 mg total) by mouth daily.  Other orders -     Vitamin D, Ergocalciferol, (DRISDOL) 1.25 MG (50000 UNIT) CAPS capsule; Take 1 capsule (50,000 Units total) by mouth once a week. -     clotrimazole-betamethasone (LOTRISONE) cream; Apply 1 application topically 2 (two) times daily. To affected areas until rash clears   Allergies as of 07/09/2020  Reactions   Penicillins Anaphylaxis   Given when he was a child and told that he almost died from it      Medication List       Accurate as of July 09, 2020  4:35 PM. If you have any questions, ask your nurse or doctor.        amLODipine 5 MG tablet Commonly known as: NORVASC Take 1 tablet (5 mg total) by mouth daily.   clotrimazole-betamethasone cream Commonly known as: Lotrisone Apply 1 application topically 2 (two) times daily. To affected areas until rash clears Started by: Claretta Fraise, MD   Vitamin D (Ergocalciferol) 1.25 MG (50000 UNIT) Caps capsule Commonly known as: DRISDOL Take 1 capsule (50,000 Units total) by mouth once a week.       Meds ordered this encounter  Medications  . DISCONTD: amLODipine  (NORVASC) 5 MG tablet    Sig: Take 1 tablet (5 mg total) by mouth daily.    Dispense:  90 tablet    Refill:  0  . Vitamin D, Ergocalciferol, (DRISDOL) 1.25 MG (50000 UNIT) CAPS capsule    Sig: Take 1 capsule (50,000 Units total) by mouth once a week.    Dispense:  13 capsule    Refill:  3  . clotrimazole-betamethasone (LOTRISONE) cream    Sig: Apply 1 application topically 2 (two) times daily. To affected areas until rash clears    Dispense:  45 g    Refill:  1  . amLODipine (NORVASC) 5 MG tablet    Sig: Take 1 tablet (5 mg total) by mouth daily.    Dispense:  90 tablet    Refill:  3      Follow-up: Return in about 6 months (around 01/09/2021).  Claretta Fraise, M.D.

## 2020-07-09 NOTE — Addendum Note (Signed)
Addended by: Baldomero Lamy B on: 07/09/2020 04:56 PM   Modules accepted: Orders

## 2020-07-10 LAB — CBC WITH DIFFERENTIAL/PLATELET
Basophils Absolute: 0.1 10*3/uL (ref 0.0–0.2)
Basos: 1 %
EOS (ABSOLUTE): 0.2 10*3/uL (ref 0.0–0.4)
Eos: 2 %
Hematocrit: 49.9 % (ref 37.5–51.0)
Hemoglobin: 17.2 g/dL (ref 13.0–17.7)
Immature Grans (Abs): 0.1 10*3/uL (ref 0.0–0.1)
Immature Granulocytes: 1 %
Lymphocytes Absolute: 2.6 10*3/uL (ref 0.7–3.1)
Lymphs: 27 %
MCH: 31.3 pg (ref 26.6–33.0)
MCHC: 34.5 g/dL (ref 31.5–35.7)
MCV: 91 fL (ref 79–97)
Monocytes Absolute: 0.7 10*3/uL (ref 0.1–0.9)
Monocytes: 8 %
Neutrophils Absolute: 5.7 10*3/uL (ref 1.4–7.0)
Neutrophils: 61 %
Platelets: 291 10*3/uL (ref 150–450)
RBC: 5.5 x10E6/uL (ref 4.14–5.80)
RDW: 12.9 % (ref 11.6–15.4)
WBC: 9.3 10*3/uL (ref 3.4–10.8)

## 2020-07-10 LAB — CMP14+EGFR
ALT: 14 IU/L (ref 0–44)
AST: 18 IU/L (ref 0–40)
Albumin/Globulin Ratio: 1.6 (ref 1.2–2.2)
Albumin: 4.1 g/dL (ref 3.8–4.8)
Alkaline Phosphatase: 98 IU/L (ref 44–121)
BUN/Creatinine Ratio: 16 (ref 10–24)
BUN: 19 mg/dL (ref 8–27)
Bilirubin Total: 0.4 mg/dL (ref 0.0–1.2)
CO2: 21 mmol/L (ref 20–29)
Calcium: 9.5 mg/dL (ref 8.6–10.2)
Chloride: 106 mmol/L (ref 96–106)
Creatinine, Ser: 1.18 mg/dL (ref 0.76–1.27)
Globulin, Total: 2.6 g/dL (ref 1.5–4.5)
Glucose: 109 mg/dL — ABNORMAL HIGH (ref 65–99)
Potassium: 4.5 mmol/L (ref 3.5–5.2)
Sodium: 147 mmol/L — ABNORMAL HIGH (ref 134–144)
Total Protein: 6.7 g/dL (ref 6.0–8.5)
eGFR: 67 mL/min/{1.73_m2} (ref >59)

## 2020-07-10 LAB — PSA, TOTAL AND FREE
PSA, Free Pct: 7.4 %
PSA, Free: 0.67 ng/mL
Prostate Specific Ag, Serum: 9.1 ng/mL — ABNORMAL HIGH (ref 0.0–4.0)

## 2020-10-20 ENCOUNTER — Other Ambulatory Visit: Payer: Self-pay | Admitting: Family Medicine

## 2020-10-20 DIAGNOSIS — I10 Essential (primary) hypertension: Secondary | ICD-10-CM

## 2021-01-13 ENCOUNTER — Other Ambulatory Visit: Payer: Self-pay

## 2021-01-13 ENCOUNTER — Encounter: Payer: Self-pay | Admitting: Family Medicine

## 2021-01-13 ENCOUNTER — Ambulatory Visit: Payer: BC Managed Care – PPO | Admitting: Family Medicine

## 2021-01-13 VITALS — BP 134/80 | HR 87 | Temp 97.8°F | Ht 67.0 in | Wt 233.4 lb

## 2021-01-13 DIAGNOSIS — L0292 Furuncle, unspecified: Secondary | ICD-10-CM | POA: Diagnosis not present

## 2021-01-13 MED ORDER — CIPROFLOXACIN HCL 500 MG PO TABS: 500.0000 mg | ORAL_TABLET | Freq: Two times a day (BID) | ORAL | 0 refills | Status: DC

## 2021-01-13 NOTE — Progress Notes (Signed)
Chief Complaint  Patient presents with   Cyst    Right lower back    HPI  Patient presents today for recurrent infection in the cyst at the lower right back . Getting tender. Not draining. Wants to have dermatology eval & removal.  PMH: Smoking status noted ROS: Per HPI  Objective: BP 134/80   Pulse 87   Temp 97.8 F (36.6 C)   Ht '5\' 7"'$  (1.702 m)   Wt 233 lb 6.4 oz (105.9 kg)   SpO2 93%   BMI 36.56 kg/m  Gen: NAD, alert, cooperative with exam HEENT: NCAT, EOMI, PERRL CV: RRR, good S1/S2, no murmur Resp: CTABL, no wheezes, non-labored Abd: SNTND, BS present, no guarding or organomegaly Ext: No edema, warm Neuro: Alert and oriented, No gross deficits  Assessment and plan:  1. Boil     Meds ordered this encounter  Medications   ciprofloxacin (CIPRO) 500 MG tablet    Sig: Take 1 tablet (500 mg total) by mouth 2 (two) times daily.    Dispense:  20 tablet    Refill:  0     Orders Placed This Encounter  Procedures   Ambulatory referral to Dermatology    Referral Priority:   Urgent    Referral Type:   Consultation    Referral Reason:   Specialty Services Required    Requested Specialty:   Dermatology    Number of Visits Requested:   1     Follow up as needed.  Claretta Fraise, MD

## 2021-01-30 ENCOUNTER — Other Ambulatory Visit: Payer: Self-pay | Admitting: Family Medicine

## 2021-01-30 DIAGNOSIS — I10 Essential (primary) hypertension: Secondary | ICD-10-CM

## 2021-02-27 ENCOUNTER — Telehealth: Payer: Self-pay | Admitting: Family Medicine

## 2021-02-27 DIAGNOSIS — I10 Essential (primary) hypertension: Secondary | ICD-10-CM

## 2021-02-27 MED ORDER — AMLODIPINE BESYLATE 5 MG PO TABS: 5.0000 mg | ORAL_TABLET | Freq: Every day | ORAL | 0 refills | Status: DC

## 2021-02-27 NOTE — Telephone Encounter (Signed)
  Prescription Request  02/27/2021  Is this a "Controlled Substance" medicine? NO  Have you seen your PCP in the last 2 weeks? NO  If YES, route message to pool  -  If NO, patient needs to be scheduled for appointment.  What is the name of the medication or equipment? Amlodipine 5 mg  Have you contacted your pharmacy to request a refill? NO   Which pharmacy would you like this sent to? [facility] in [place]   Patient notified that their request is being sent to the clinical staff for review and that they should receive a response within 2 business days.

## 2021-02-27 NOTE — Telephone Encounter (Signed)
Refill sent to pharmacy.  Patient aware via voicemail.

## 2021-03-23 ENCOUNTER — Ambulatory Visit: Payer: BC Managed Care – PPO | Admitting: Family Medicine

## 2021-03-23 ENCOUNTER — Encounter: Payer: Self-pay | Admitting: Family Medicine

## 2021-03-23 ENCOUNTER — Other Ambulatory Visit: Payer: Self-pay

## 2021-03-23 VITALS — BP 130/80 | HR 105 | Temp 98.2°F | Ht 67.0 in | Wt 236.8 lb

## 2021-03-23 DIAGNOSIS — R972 Elevated prostate specific antigen [PSA]: Secondary | ICD-10-CM

## 2021-03-23 DIAGNOSIS — F17200 Nicotine dependence, unspecified, uncomplicated: Secondary | ICD-10-CM

## 2021-03-23 DIAGNOSIS — Z122 Encounter for screening for malignant neoplasm of respiratory organs: Secondary | ICD-10-CM

## 2021-03-23 DIAGNOSIS — I10 Essential (primary) hypertension: Secondary | ICD-10-CM

## 2021-03-23 DIAGNOSIS — E785 Hyperlipidemia, unspecified: Secondary | ICD-10-CM

## 2021-03-23 MED ORDER — AMLODIPINE BESYLATE 5 MG PO TABS: 5.0000 mg | ORAL_TABLET | Freq: Every day | ORAL | 3 refills | Status: DC

## 2021-03-23 NOTE — Progress Notes (Signed)
Subjective:  Patient ID: Darrell Beard., male    DOB: 03/31/1951  Age: 70 y.o. MRN: [id number]  CC: Medical Management of Chronic Issues   HPI Elliott Lasecki.  presents for  follow-up of hypertension. Patient has no history of headache chest pain or shortness of breath or recent cough. Patient also denies symptoms of TIA such as focal numbness or weakness. Patient denies side effects from medication. States taking it regularly.  History Lizzie has no past medical history on file.   He has a past surgical history that includes Appendectomy; Tonsillectomy; Knee surgery (Bilateral); and Joint replacement.   His family history includes Cancer in his father.He reports that he has been smoking cigarettes. He has a 35.00 pack-year smoking history. He has never used smokeless tobacco. He reports current alcohol use. He reports that he does not use drugs.  Current Outpatient Medications on File Prior to Visit  Medication Sig Dispense Refill   clotrimazole-betamethasone (LOTRISONE) cream Apply 1 application topically 2 (two) times daily. To affected areas until rash clears 45 g 1   doxycycline (VIBRAMYCIN) 100 MG capsule Take 100 mg by mouth 2 (two) times daily.     Vitamin D, Ergocalciferol, (DRISDOL) 1.25 MG (50000 UNIT) CAPS capsule Take 1 capsule (50,000 Units total) by mouth once a week. 13 capsule 3   No current facility-administered medications on file prior to visit.    ROS Review of Systems  Constitutional:  Negative for fever.  Respiratory:  Negative for shortness of breath.   Cardiovascular:  Negative for chest pain.  Musculoskeletal:  Negative for arthralgias.  Skin:  Negative for rash.   Objective:  BP 130/80   Pulse (!) 105   Temp 98.2 F (36.8 C)   Ht '5\' 7"'  (1.702 m)   Wt 236 lb 12.8 oz (107.4 kg)   SpO2 97%   BMI 37.09 kg/m   BP Readings from Last 3 Encounters:  03/23/21 130/80  01/13/21 134/80  07/09/20 135/77    Wt Readings from Last 3 Encounters:   03/23/21 236 lb 12.8 oz (107.4 kg)  01/13/21 233 lb 6.4 oz (105.9 kg)  07/09/20 241 lb (109.3 kg)     Physical Exam Vitals reviewed.  Constitutional:      Appearance: He is well-developed.  HENT:     Head: Normocephalic and atraumatic.     Right Ear: External ear normal.     Left Ear: External ear normal.     Mouth/Throat:     Pharynx: No oropharyngeal exudate or posterior oropharyngeal erythema.  Eyes:     Pupils: Pupils are equal, round, and reactive to light.  Cardiovascular:     Rate and Rhythm: Normal rate and regular rhythm.     Heart sounds: No murmur heard. Pulmonary:     Effort: No respiratory distress.     Breath sounds: Normal breath sounds.  Musculoskeletal:     Cervical back: Normal range of motion and neck supple.  Neurological:     Mental Status: He is alert and oriented to person, place, and time.    No results found for: HGBA1C  Lab Results  Component Value Date   WBC 9.3 07/09/2020   HGB 17.2 07/09/2020   HCT 49.9 07/09/2020   PLT 291 07/09/2020   GLUCOSE 109 (H) 07/09/2020   CHOL 165 05/16/2019   TRIG 71 05/16/2019   HDL 49 05/16/2019   LDLCALC 102 (H) 05/16/2019   ALT 14 07/09/2020   AST 18 07/09/2020   NA  147 (H) 07/09/2020   K 4.5 07/09/2020   CL 106 07/09/2020   CREATININE 1.18 07/09/2020   BUN 19 07/09/2020   CO2 21 07/09/2020    DG Eye Foreign Body  Result Date: 06/22/2019 CLINICAL DATA:  Middle exposure EXAM: ORBITS FOR FOREIGN BODY - 2 VIEW COMPARISON:  No prior. FINDINGS: No evidence of metallic foreign body. Patient is cleared for MRI. No acute abnormality identified. 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 Sinuses are clear. Mastoids are clear. IMPRESSION: No evidence of patellar foreign body.  Patient is cleared for MRI. Electronically Signed   By: Marcello Moores  Register   On: 06/22/2019 11:23   MR PROSTATE W WO CONTRAST  Result Date: 06/22/2019 CLINICAL DATA:  Elevated PSA. EXAM: MR PROSTATE WITHOUT AND WITH CONTRAST TECHNIQUE:  Multiplanar multisequence MRI images were obtained of the pelvis centered about the prostate. Pre and post contrast images were obtained. CONTRAST:  71m MULTIHANCE GADOBENATE DIMEGLUMINE 529 MG/ML IV SOLN COMPARISON:  None. FINDINGS: Prostate: -- Peripheral Zone: Linear/wedge shaped hypointensity is seen on ADC; however, no focal ADC hypointense or high b-value DWI hyperintense nodules are identified. 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 -- Measurements/Volume:  4.4 x 3.9 x 5.0 cm (volume = 45 cm^3) Transcapsular spread:  Absent Seminal vesicle involvement:  Absent Neurovascular bundle involvement:  Absent Pelvic adenopathy: None visualized Bone metastasis: None visualized Other: Severe sigmoid diverticulosis, without evidence of diverticulitis. IMPRESSION: No radiographic evidence of high-grade prostate carcinoma. PI-RADS 2: Low (clinically significant cancer is unlikely to be present) Electronically Signed   By: JMarlaine HindM.D.   On: 06/22/2019 14:07    Assessment & Plan:   JBaronwas seen today for medical management of chronic issues.  Diagnoses and all orders for this visit:  Essential hypertension -     CBC with Differential/Platelet -     CMP14+EGFR -     amLODipine (NORVASC) 5 MG tablet; Take 1 tablet (5 mg total) by mouth daily. (NEEDS TO BE SEEN BEFORE NEXT REFILL)  Hyperlipidemia, unspecified hyperlipidemia type -     Lipid panel  Elevated PSA -     PSA, total and free  Encounter for screening for malignant neoplasm of lung in current smoker with 30 pack year history or greater -     CT CHEST LUNG CANCER SCREENING LOW DOSE WO CONTRAST; Future  I have discontinued JJuliette AlcideJr.'s ciprofloxacin. I am also having him maintain his Vitamin D (Ergocalciferol), clotrimazole-betamethasone, amLODipine, and doxycycline.  Meds ordered this encounter  Medications   amLODipine  (NORVASC) 5 MG tablet    Sig: Take 1 tablet (5 mg total) by mouth daily. (NEEDS TO BE SEEN BEFORE NEXT REFILL)    Dispense:  90 tablet    Refill:  3     Follow-up: Return in about 6 months (around 09/20/2021).  WClaretta Fraise M.D.

## 2021-03-24 LAB — CBC WITH DIFFERENTIAL/PLATELET
Basophils Absolute: 0.1 10*3/uL (ref 0.0–0.2)
Basos: 1 %
EOS (ABSOLUTE): 0.1 10*3/uL (ref 0.0–0.4)
Eos: 1 %
Hematocrit: 53.3 % — ABNORMAL HIGH (ref 37.5–51.0)
Hemoglobin: 18.3 g/dL — ABNORMAL HIGH (ref 13.0–17.7)
Immature Grans (Abs): 0.1 10*3/uL (ref 0.0–0.1)
Immature Granulocytes: 1 %
Lymphocytes Absolute: 2 10*3/uL (ref 0.7–3.1)
Lymphs: 21 %
MCH: 31.1 pg (ref 26.6–33.0)
MCHC: 34.3 g/dL (ref 31.5–35.7)
MCV: 91 fL (ref 79–97)
Monocytes Absolute: 0.8 10*3/uL (ref 0.1–0.9)
Monocytes: 8 %
Neutrophils Absolute: 6.3 10*3/uL (ref 1.4–7.0)
Neutrophils: 68 %
Platelets: 283 10*3/uL (ref 150–450)
RBC: 5.88 x10E6/uL — ABNORMAL HIGH (ref 4.14–5.80)
RDW: 12.3 % (ref 11.6–15.4)
WBC: 9.4 10*3/uL (ref 3.4–10.8)

## 2021-03-24 LAB — CMP14+EGFR
ALT: 19 IU/L (ref 0–44)
AST: 17 IU/L (ref 0–40)
Albumin/Globulin Ratio: 1.9 (ref 1.2–2.2)
Albumin: 4.2 g/dL (ref 3.8–4.8)
Alkaline Phosphatase: 96 IU/L (ref 44–121)
BUN/Creatinine Ratio: 19 (ref 10–24)
BUN: 19 mg/dL (ref 8–27)
Bilirubin Total: 0.4 mg/dL (ref 0.0–1.2)
CO2: 25 mmol/L (ref 20–29)
Calcium: 9.5 mg/dL (ref 8.6–10.2)
Chloride: 100 mmol/L (ref 96–106)
Creatinine, Ser: 1.02 mg/dL (ref 0.76–1.27)
Globulin, Total: 2.2 g/dL (ref 1.5–4.5)
Glucose: 81 mg/dL (ref 70–99)
Potassium: 4.7 mmol/L (ref 3.5–5.2)
Sodium: 138 mmol/L (ref 134–144)
Total Protein: 6.4 g/dL (ref 6.0–8.5)
eGFR: 80 mL/min/{1.73_m2} (ref >59)

## 2021-03-24 LAB — PSA, TOTAL AND FREE
PSA, Free Pct: 8.4 %
PSA, Free: 0.92 ng/mL
Prostate Specific Ag, Serum: 11 ng/mL — ABNORMAL HIGH (ref 0.0–4.0)

## 2021-03-24 LAB — LIPID PANEL
Chol/HDL Ratio: 3 ratio (ref 0.0–5.0)
Cholesterol, Total: 155 mg/dL (ref 100–199)
HDL: 52 mg/dL (ref >39)
LDL Chol Calc (NIH): 83 mg/dL (ref 0–99)
Triglycerides: 113 mg/dL (ref 0–149)
VLDL Cholesterol Cal: 20 mg/dL (ref 5–40)

## 2021-03-31 ENCOUNTER — Other Ambulatory Visit: Payer: Self-pay | Admitting: *Deleted

## 2021-03-31 MED ORDER — VITAMIN D (ERGOCALCIFEROL) 1.25 MG (50000 UNIT) PO CAPS: 50000.0000 [IU] | ORAL_CAPSULE | ORAL | 3 refills | Status: DC

## 2021-04-23 ENCOUNTER — Other Ambulatory Visit: Payer: Self-pay | Admitting: Urology

## 2021-04-23 DIAGNOSIS — R972 Elevated prostate specific antigen [PSA]: Secondary | ICD-10-CM

## 2021-05-26 ENCOUNTER — Ambulatory Visit
Admission: RE | Admit: 2021-05-26 | Discharge: 2021-05-26 | Disposition: A | Discharge status: Home / Self Care | Payer: BC Managed Care – PPO | Source: Ambulatory Visit | Attending: Urology | Admitting: Urology

## 2021-05-26 ENCOUNTER — Other Ambulatory Visit: Payer: Self-pay

## 2021-05-26 DIAGNOSIS — R972 Elevated prostate specific antigen [PSA]: Secondary | ICD-10-CM

## 2021-05-26 MED ORDER — GADOBENATE DIMEGLUMINE 529 MG/ML IV SOLN
20.0000 mL | Freq: Once | INTRAVENOUS | Status: AC | PRN
  Administered 2021-05-26: 20 mL via INTRAVENOUS

## 2021-06-17 ENCOUNTER — Ambulatory Visit
Admission: RE | Admit: 2021-06-17 | Discharge: 2021-06-17 | Disposition: A | Discharge status: Home / Self Care | Payer: BC Managed Care – PPO | Source: Ambulatory Visit | Attending: Family Medicine | Admitting: Family Medicine

## 2021-06-17 DIAGNOSIS — Z122 Encounter for screening for malignant neoplasm of respiratory organs: Secondary | ICD-10-CM

## 2021-06-17 DIAGNOSIS — F17200 Nicotine dependence, unspecified, uncomplicated: Secondary | ICD-10-CM

## 2022-04-22 ENCOUNTER — Other Ambulatory Visit: Payer: Self-pay | Admitting: Family Medicine

## 2022-04-22 DIAGNOSIS — I10 Essential (primary) hypertension: Secondary | ICD-10-CM

## 2022-04-22 MED ORDER — AMLODIPINE BESYLATE 5 MG PO TABS: 5.0000 mg | ORAL_TABLET | Freq: Every day | ORAL | 0 refills | Status: DC

## 2022-04-22 NOTE — Telephone Encounter (Signed)
Patient has scheduled an appointment for his b/p medication refill.  He only has 4 pills.  His appt is scheduled for Jan 2nd.  Can we please call him in enough until his appointment please.  [facility] [place]

## 2022-05-04 ENCOUNTER — Ambulatory Visit: Payer: BC Managed Care – PPO | Admitting: Family Medicine

## 2022-05-04 ENCOUNTER — Encounter: Payer: Self-pay | Admitting: Family Medicine

## 2022-05-04 VITALS — BP 140/67 | HR 88 | Temp 98.0°F | Ht 67.0 in | Wt 233.6 lb

## 2022-05-04 DIAGNOSIS — F17200 Nicotine dependence, unspecified, uncomplicated: Secondary | ICD-10-CM

## 2022-05-04 DIAGNOSIS — E559 Vitamin D deficiency, unspecified: Secondary | ICD-10-CM

## 2022-05-04 DIAGNOSIS — Z122 Encounter for screening for malignant neoplasm of respiratory organs: Secondary | ICD-10-CM | POA: Diagnosis not present

## 2022-05-04 DIAGNOSIS — I10 Essential (primary) hypertension: Secondary | ICD-10-CM

## 2022-05-04 DIAGNOSIS — E785 Hyperlipidemia, unspecified: Secondary | ICD-10-CM | POA: Diagnosis not present

## 2022-05-04 MED ORDER — AMLODIPINE BESYLATE 5 MG PO TABS: 5.0000 mg | ORAL_TABLET | Freq: Every day | ORAL | 3 refills | Status: DC

## 2022-05-04 NOTE — Progress Notes (Signed)
Subjective:  Patient ID: Darrell Graves., male    DOB: 08/04/1950  Age: 72 y.o. MRN: [id number]  CC: Medical Management of Chronic Issues   HPI Darrell Stief. presents for  follow-up of hypertension. Patient has no history of headache chest pain or shortness of breath or recent cough. Patient also denies symptoms of TIA such as focal numbness or weakness. Patient denies side effects from medication. States taking it regularly. Home readings are "good." Daughter is a Marine scientist and checks it frequently.    History Darrell Beard has no past medical history on file.   He has a past surgical history that includes Appendectomy; Tonsillectomy; Knee surgery (Bilateral); and Joint replacement.   His family history includes Cancer in his father.He reports that he has been smoking cigarettes. He has a 35.00 pack-year smoking history. He has never used smokeless tobacco. He reports current alcohol use. He reports that he does not use drugs.  Current Outpatient Medications on File Prior to Visit  Medication Sig Dispense Refill   clotrimazole-betamethasone (LOTRISONE) cream Apply 1 application topically 2 (two) times daily. To affected areas until rash clears 45 g 1   Vitamin D, Ergocalciferol, (DRISDOL) 1.25 MG (50000 UNIT) CAPS capsule Take 1 capsule (50,000 Units total) by mouth once a week. 13 capsule 3   No current facility-administered medications on file prior to visit.    ROS Review of Systems  Constitutional:  Negative for fever.  Respiratory:  Negative for shortness of breath.   Cardiovascular:  Negative for chest pain.  Musculoskeletal:  Negative for arthralgias.  Skin:  Negative for rash.    Objective:  BP (!) 140/67   Pulse 88   Temp 98 F (36.7 C)   Ht _0  (1.702 m)   Wt 233 lb 9.6 oz (106 kg)   SpO2 94%   BMI 36.59 kg/m   BP Readings from Last 3 Encounters:  05/04/22 (!) 140/67  03/23/21 130/80  01/13/21 134/80    Wt Readings from Last 3 Encounters:  05/04/22 233 lb  9.6 oz (106 kg)  03/23/21 236 lb 12.8 oz (107.4 kg)  01/13/21 233 lb 6.4 oz (105.9 kg)     Physical Exam Vitals reviewed.  Constitutional:      Appearance: He is well-developed.  HENT:     Head: Normocephalic and atraumatic.     Right Ear: External ear normal.     Left Ear: External ear normal.     Mouth/Throat:     Pharynx: No oropharyngeal exudate or posterior oropharyngeal erythema.  Eyes:     Pupils: Pupils are equal, round, and reactive to light.  Cardiovascular:     Rate and Rhythm: Normal rate and regular rhythm.     Heart sounds: No murmur heard. Pulmonary:     Effort: No respiratory distress.     Breath sounds: Normal breath sounds.  Musculoskeletal:     Cervical back: Normal range of motion and neck supple.  Neurological:     Mental Status: He is alert and oriented to person, place, and time.       Assessment & Plan:   Darrell Beard was seen today for medical management of chronic issues.  Diagnoses and all orders for this visit:  Essential hypertension -     CBC with Differential/Platelet -     CMP14+EGFR -     amLODipine (NORVASC) 5 MG tablet; Take 1 tablet (5 mg total) by mouth daily.  Hyperlipidemia, unspecified hyperlipidemia type -     Lipid  panel  Vitamin D deficiency -     Vitamin D, 25-hydroxy   Allergies as of 05/04/2022       Reactions   Penicillins Anaphylaxis   Given when he was a child and told that he almost died from it        Medication List        Accurate as of May 04, 2022  3:39 PM. If you have any questions, ask your nurse or doctor.          STOP taking these medications    doxycycline 100 MG capsule Commonly known as: VIBRAMYCIN Stopped by: Claretta Fraise, MD       TAKE these medications    amLODipine 5 MG tablet Commonly known as: NORVASC Take 1 tablet (5 mg total) by mouth daily.   clotrimazole-betamethasone cream Commonly known as: Lotrisone Apply 1 application topically 2 (two) times daily. To affected  areas until rash clears   Vitamin D (Ergocalciferol) 1.25 MG (50000 UNIT) Caps capsule Commonly known as: DRISDOL Take 1 capsule (50,000 Units total) by mouth once a week.        Meds ordered this encounter  Medications   amLODipine (NORVASC) 5 MG tablet    Sig: Take 1 tablet (5 mg total) by mouth daily.    Dispense:  90 tablet    Refill:  3      Follow-up: Return in about 6 months (around 11/02/2022).  Claretta Fraise, M.D.

## 2022-05-05 ENCOUNTER — Telehealth: Payer: Self-pay

## 2022-05-05 LAB — CBC WITH DIFFERENTIAL/PLATELET
Basophils Absolute: 0.1 10*3/uL (ref 0.0–0.2)
Basos: 1 %
EOS (ABSOLUTE): 0.1 10*3/uL (ref 0.0–0.4)
Eos: 1 %
Hematocrit: 52.1 % — ABNORMAL HIGH (ref 37.5–51.0)
Hemoglobin: 17.9 g/dL — ABNORMAL HIGH (ref 13.0–17.7)
Immature Grans (Abs): 0.1 10*3/uL (ref 0.0–0.1)
Immature Granulocytes: 1 %
Lymphocytes Absolute: 3.5 10*3/uL — ABNORMAL HIGH (ref 0.7–3.1)
Lymphs: 33 %
MCH: 31.5 pg (ref 26.6–33.0)
MCHC: 34.4 g/dL (ref 31.5–35.7)
MCV: 92 fL (ref 79–97)
Monocytes Absolute: 0.8 10*3/uL (ref 0.1–0.9)
Monocytes: 7 %
Neutrophils Absolute: 6.1 10*3/uL (ref 1.4–7.0)
Neutrophils: 57 %
Platelets: 275 10*3/uL (ref 150–450)
RBC: 5.68 x10E6/uL (ref 4.14–5.80)
RDW: 12.5 % (ref 11.6–15.4)
WBC: 10.6 10*3/uL (ref 3.4–10.8)

## 2022-05-05 LAB — CMP14+EGFR
ALT: 17 IU/L (ref 0–44)
AST: 20 IU/L (ref 0–40)
Albumin/Globulin Ratio: 1.5 (ref 1.2–2.2)
Albumin: 4.1 g/dL (ref 3.9–4.9)
Alkaline Phosphatase: 91 IU/L (ref 44–121)
BUN/Creatinine Ratio: 13 (ref 10–24)
BUN: 15 mg/dL (ref 8–27)
Bilirubin Total: 0.5 mg/dL (ref 0.0–1.2)
CO2: 26 mmol/L (ref 20–29)
Calcium: 9.3 mg/dL (ref 8.6–10.2)
Chloride: 101 mmol/L (ref 96–106)
Creatinine, Ser: 1.17 mg/dL (ref 0.76–1.27)
Globulin, Total: 2.7 g/dL (ref 1.5–4.5)
Glucose: 89 mg/dL (ref 70–99)
Potassium: 4.2 mmol/L (ref 3.5–5.2)
Sodium: 140 mmol/L (ref 134–144)
Total Protein: 6.8 g/dL (ref 6.0–8.5)
eGFR: 67 mL/min/{1.73_m2} (ref >59)

## 2022-05-05 LAB — LIPID PANEL
Chol/HDL Ratio: 2.8 ratio (ref 0.0–5.0)
Cholesterol, Total: 155 mg/dL (ref 100–199)
HDL: 55 mg/dL (ref >39)
LDL Chol Calc (NIH): 82 mg/dL (ref 0–99)
Triglycerides: 100 mg/dL (ref 0–149)
VLDL Cholesterol Cal: 18 mg/dL (ref 5–40)

## 2022-05-05 LAB — VITAMIN D 25 HYDROXY (VIT D DEFICIENCY, FRACTURES): Vit D, 25-Hydroxy: 53 ng/mL (ref 30.0–100.0)

## 2022-05-05 NOTE — Telephone Encounter (Signed)
Left voicemail with call back number for patient to call to schedule annual LDCT

## 2022-05-05 NOTE — Progress Notes (Signed)
Hello Stonewall,  Your lab result is normal and/or stable.Some minor variations that are not significant are commonly marked abnormal, but do not represent any medical problem for you.  Best regards, Katira Dumais, M.D.

## 2022-06-30 ENCOUNTER — Other Ambulatory Visit: Payer: Self-pay | Admitting: Family Medicine

## 2022-07-02 ENCOUNTER — Encounter: Payer: Self-pay | Admitting: Family Medicine

## 2022-07-08 ENCOUNTER — Other Ambulatory Visit: Payer: Self-pay | Admitting: Family Medicine

## 2023-01-28 ENCOUNTER — Other Ambulatory Visit: Payer: Self-pay | Admitting: Family Medicine

## 2023-03-10 ENCOUNTER — Encounter: Payer: Self-pay | Admitting: Family Medicine

## 2023-03-10 ENCOUNTER — Ambulatory Visit: Payer: BC Managed Care – PPO | Admitting: Family Medicine

## 2023-03-10 VITALS — BP 126/68 | HR 95 | Temp 98.2°F | Ht 67.0 in | Wt 225.8 lb

## 2023-03-10 DIAGNOSIS — M1712 Unilateral primary osteoarthritis, left knee: Secondary | ICD-10-CM

## 2023-03-10 DIAGNOSIS — I10 Essential (primary) hypertension: Secondary | ICD-10-CM | POA: Diagnosis not present

## 2023-03-10 DIAGNOSIS — M1711 Unilateral primary osteoarthritis, right knee: Secondary | ICD-10-CM

## 2023-03-10 DIAGNOSIS — E559 Vitamin D deficiency, unspecified: Secondary | ICD-10-CM | POA: Diagnosis not present

## 2023-03-10 DIAGNOSIS — E785 Hyperlipidemia, unspecified: Secondary | ICD-10-CM

## 2023-03-10 MED ORDER — CLOTRIMAZOLE-BETAMETHASONE 1-0.05 % EX CREA: 1.0000 | TOPICAL_CREAM | Freq: Two times a day (BID) | CUTANEOUS | 1 refills | Status: DC

## 2023-03-10 MED ORDER — AMLODIPINE BESYLATE 5 MG PO TABS: 5.0000 mg | ORAL_TABLET | Freq: Every day | ORAL | 3 refills | Status: DC

## 2023-03-10 MED ORDER — LISINOPRIL 10 MG PO TABS: 10.0000 mg | ORAL_TABLET | Freq: Every day | ORAL | 3 refills | Status: DC

## 2023-03-10 NOTE — Progress Notes (Signed)
Subjective:  Patient ID: Cephus Richer., male    DOB: May 29, 1950  Age: 72 y.o. MRN: [id number]  CC: Medical Management of Chronic Issues   HPI Lyal Scull. presents for  presents for  follow-up of hypertension. Patient has no history of headache chest pain or shortness of breath or recent cough. Patient also denies symptoms of TIA such as focal numbness or weakness. Patient denies side effects from medication. States taking it regularly.   Planning R knee replacement soon.    in for follow-up of elevated cholesterol. Doing well without complaints on current medication. Denies side effects of statin including myalgia and arthralgia and nausea. Currently no chest pain, shortness of breath or other cardiovascular related symptoms noted.      03/10/2023    2:23 PM 05/04/2022    3:14 PM 03/23/2021    8:13 AM  Depression screen PHQ 2/9  Decreased Interest 0 0 0  Down, Depressed, Hopeless 0 0 0  PHQ - 2 Score 0 0 0    History Matthews has no past medical history on file.   He has a past surgical history that includes Appendectomy; Tonsillectomy; Knee surgery (Bilateral); and Joint replacement.   His family history includes Cancer in his father.He reports that he has been smoking cigarettes. He has a 35 pack-year smoking history. He has never used smokeless tobacco. He reports current alcohol use. He reports that he does not use drugs.    ROS Review of Systems  Constitutional: Negative.  Negative for fever.  HENT: Negative.    Eyes:  Negative for visual disturbance.  Respiratory:  Negative for cough and shortness of breath.   Cardiovascular:  Negative for chest pain and leg swelling.  Gastrointestinal:  Negative for abdominal pain, diarrhea, nausea and vomiting.  Genitourinary:  Negative for difficulty urinating.  Musculoskeletal:  Positive for arthralgias. Negative for myalgias.  Skin:  Negative for rash.  Neurological:  Negative for headaches.  Psychiatric/Behavioral:   Negative for sleep disturbance.   All other systems reviewed and are negative.   Objective:  BP 126/68   Pulse 95   Temp 98.2 F (36.8 C)   Ht 5\' 7"  (1.702 m)   Wt 225 lb 12.8 oz (102.4 kg)   SpO2 93%   BMI 35.37 kg/m   BP Readings from Last 3 Encounters:  03/10/23 126/68  05/04/22 (!) 140/67  03/23/21 130/80    Wt Readings from Last 3 Encounters:  03/10/23 225 lb 12.8 oz (102.4 kg)  05/04/22 233 lb 9.6 oz (106 kg)  03/23/21 236 lb 12.8 oz (107.4 kg)     Physical Exam Vitals reviewed.  Constitutional:      Appearance: He is well-developed.  HENT:     Head: Normocephalic and atraumatic.     Right Ear: External ear normal.     Left Ear: External ear normal.     Mouth/Throat:     Pharynx: No oropharyngeal exudate or posterior oropharyngeal erythema.  Eyes:     Pupils: Pupils are equal, round, and reactive to light.  Cardiovascular:     Rate and Rhythm: Normal rate and regular rhythm.     Heart sounds: No murmur heard. Pulmonary:     Effort: No respiratory distress.     Breath sounds: Normal breath sounds.  Musculoskeletal:     Cervical back: Normal range of motion and neck supple.  Neurological:     Mental Status: He is alert and oriented to person, place, and time.  Assessment & Plan:   Massimiliano was seen today for medical management of chronic issues.  Diagnoses and all orders for this visit:  Essential hypertension -     CBC with Differential/Platelet -     CMP14+EGFR -     Discontinue: amLODipine (NORVASC) 5 MG tablet; Take 1 tablet (5 mg total) by mouth daily. -     amLODipine (NORVASC) 5 MG tablet; Take 1 tablet (5 mg total) by mouth daily.  Hyperlipidemia, unspecified hyperlipidemia type -     Lipid panel  Vitamin D deficiency -     VITAMIN D 25 Hydroxy (Vit-D Deficiency, Fractures)  Arthritis of right knee  Other orders -     clotrimazole-betamethasone (LOTRISONE) cream; Apply 1 Application topically 2 (two) times daily. To affected  areas until rash clears -     Discontinue: lisinopril (ZESTRIL) 10 MG tablet; Take 1 tablet (10 mg total) by mouth daily.       I have discontinued Faythe Ghee Jr.'s lisinopril. I am also having him maintain his Vitamin D (Ergocalciferol), clotrimazole-betamethasone, and amLODipine.  Allergies as of 03/10/2023       Reactions   Penicillins Anaphylaxis   Given when he was a child and told that he almost died from it        Medication List        Accurate as of March 10, 2023 11:59 PM. If you have any questions, ask your nurse or doctor.          amLODipine 5 MG tablet Commonly known as: NORVASC Take 1 tablet (5 mg total) by mouth daily.   clotrimazole-betamethasone cream Commonly known as: Lotrisone Apply 1 Application topically 2 (two) times daily. To affected areas until rash clears   Vitamin D (Ergocalciferol) 1.25 MG (50000 UNIT) Caps capsule Commonly known as: DRISDOL Take 1 capsule by mouth once a week         Follow-up: Return in about 1 year (around 03/09/2024) for [facility] physical.  Mechele Claude, M.D.

## 2023-03-11 LAB — CMP14+EGFR
ALT: 18 [IU]/L (ref 0–44)
AST: 20 [IU]/L (ref 0–40)
Albumin: 4.1 g/dL (ref 3.9–4.9)
Alkaline Phosphatase: 91 [IU]/L (ref 44–121)
BUN/Creatinine Ratio: 12 (ref 10–24)
BUN: 13 mg/dL (ref 8–27)
Bilirubin Total: 0.4 mg/dL (ref 0.0–1.2)
CO2: 26 mmol/L (ref 20–29)
Calcium: 10 mg/dL (ref 8.6–10.2)
Chloride: 101 mmol/L (ref 96–106)
Creatinine, Ser: 1.12 mg/dL (ref 0.76–1.27)
Globulin, Total: 2.6 g/dL (ref 1.5–4.5)
Glucose: 98 mg/dL (ref 70–99)
Potassium: 4.5 mmol/L (ref 3.5–5.2)
Sodium: 142 mmol/L (ref 134–144)
Total Protein: 6.7 g/dL (ref 6.0–8.5)
eGFR: 71 mL/min/{1.73_m2} (ref >59)

## 2023-03-11 LAB — LIPID PANEL
Chol/HDL Ratio: 3.4 ratio (ref 0.0–5.0)
Cholesterol, Total: 176 mg/dL (ref 100–199)
HDL: 52 mg/dL (ref >39)
LDL Chol Calc (NIH): 103 mg/dL — ABNORMAL HIGH (ref 0–99)
Triglycerides: 119 mg/dL (ref 0–149)
VLDL Cholesterol Cal: 21 mg/dL (ref 5–40)

## 2023-03-11 LAB — CBC WITH DIFFERENTIAL/PLATELET
Basophils Absolute: 0.1 10*3/uL (ref 0.0–0.2)
Basos: 1 %
EOS (ABSOLUTE): 0.2 10*3/uL (ref 0.0–0.4)
Eos: 2 %
Hematocrit: 52.3 % — ABNORMAL HIGH (ref 37.5–51.0)
Hemoglobin: 17.6 g/dL (ref 13.0–17.7)
Immature Grans (Abs): 0.1 10*3/uL (ref 0.0–0.1)
Immature Granulocytes: 1 %
Lymphocytes Absolute: 3.1 10*3/uL (ref 0.7–3.1)
Lymphs: 35 %
MCH: 32.2 pg (ref 26.6–33.0)
MCHC: 33.7 g/dL (ref 31.5–35.7)
MCV: 96 fL (ref 79–97)
Monocytes Absolute: 0.8 10*3/uL (ref 0.1–0.9)
Monocytes: 9 %
Neutrophils Absolute: 4.7 10*3/uL (ref 1.4–7.0)
Neutrophils: 52 %
Platelets: 282 10*3/uL (ref 150–450)
RBC: 5.46 x10E6/uL (ref 4.14–5.80)
RDW: 13 % (ref 11.6–15.4)
WBC: 8.8 10*3/uL (ref 3.4–10.8)

## 2023-03-11 LAB — VITAMIN D 25 HYDROXY (VIT D DEFICIENCY, FRACTURES): Vit D, 25-Hydroxy: 51.2 ng/mL (ref 30.0–100.0)

## 2023-03-13 NOTE — Progress Notes (Signed)
Hello Affan,  Your lab result is normal and/or stable.Some minor variations that are not significant are commonly marked abnormal, but do not represent any medical problem for you.  Best regards, Dontrae Morini, M.D.

## 2023-05-06 ENCOUNTER — Other Ambulatory Visit: Payer: Self-pay | Admitting: Family Medicine

## 2023-05-06 NOTE — Telephone Encounter (Signed)
 Stacks patient Last office visit 03/10/23 Last refill 03/10/23;, 45 grams, 1 refill

## 2023-06-22 ENCOUNTER — Other Ambulatory Visit (HOSPITAL_COMMUNITY): Payer: Self-pay

## 2023-06-22 MED ORDER — LEVOFLOXACIN 750 MG PO TABS
750.0000 mg | ORAL_TABLET | ORAL | 0 refills | Status: DC
  Filled 2023-06-22 – 2023-07-19 (×2): qty 1, 1d supply, fill #0

## 2023-06-24 ENCOUNTER — Other Ambulatory Visit: Payer: Self-pay | Admitting: Urology

## 2023-06-24 DIAGNOSIS — R972 Elevated prostate specific antigen [PSA]: Secondary | ICD-10-CM

## 2023-07-06 ENCOUNTER — Other Ambulatory Visit (HOSPITAL_COMMUNITY): Payer: Self-pay

## 2023-07-07 ENCOUNTER — Other Ambulatory Visit (HOSPITAL_COMMUNITY): Payer: Self-pay

## 2023-07-19 ENCOUNTER — Other Ambulatory Visit (HOSPITAL_COMMUNITY): Payer: Self-pay

## 2023-08-16 ENCOUNTER — Ambulatory Visit
Admission: RE | Admit: 2023-08-16 | Discharge: 2023-08-16 | Disposition: A | Discharge status: Home / Self Care | Payer: Self-pay | Source: Ambulatory Visit | Attending: Urology | Admitting: Urology

## 2023-08-16 DIAGNOSIS — R972 Elevated prostate specific antigen [PSA]: Secondary | ICD-10-CM

## 2023-08-16 MED ORDER — GADOPICLENOL 0.5 MMOL/ML IV SOLN
10.0000 mL | Freq: Once | INTRAVENOUS | Status: AC | PRN
Start: 2023-08-16 — End: 2023-08-16
  Administered 2023-08-16: 10 mL via INTRAVENOUS

## 2023-09-06 ENCOUNTER — Other Ambulatory Visit (HOSPITAL_COMMUNITY): Payer: Self-pay

## 2023-09-06 MED ORDER — LEVOFLOXACIN 750 MG PO TABS
750.0000 mg | ORAL_TABLET | ORAL | 0 refills | Status: DC
  Filled 2023-09-06: qty 1, 1d supply, fill #0

## 2023-09-16 ENCOUNTER — Other Ambulatory Visit (HOSPITAL_COMMUNITY): Payer: Self-pay

## 2023-10-03 ENCOUNTER — Telehealth: Payer: Self-pay | Admitting: Family Medicine

## 2023-10-03 DIAGNOSIS — Z0279 Encounter for issue of other medical certificate: Secondary | ICD-10-CM

## 2023-10-03 NOTE — Telephone Encounter (Signed)
 Darrell Beard REQUESTED HANDICAP forms to be completed and signed.  Form Fee Paid? (Y/N)       YES     If NO, form is placed on front office manager desk to hold until payment received. If YES, then form will be placed in the RX/HH Nurse Coordinators box for completion.  Form will not be processed until payment is received

## 2023-10-04 ENCOUNTER — Other Ambulatory Visit (HOSPITAL_COMMUNITY): Payer: Self-pay | Admitting: Urology

## 2023-10-04 DIAGNOSIS — C61 Malignant neoplasm of prostate: Secondary | ICD-10-CM

## 2023-10-06 NOTE — Telephone Encounter (Signed)
[facility] handicap form ready

## 2023-10-12 ENCOUNTER — Telehealth: Payer: Self-pay | Admitting: Family Medicine

## 2023-10-12 DIAGNOSIS — Z0279 Encounter for issue of other medical certificate: Secondary | ICD-10-CM

## 2023-10-12 NOTE — Telephone Encounter (Signed)
 PT dropped off HANDICAP forms to be completed and signed.  Form Fee Paid? (Y/N)       YES     If NO, form is placed on front office manager desk to hold until payment received. If YES, then form will be placed in the RX/HH Nurse Coordinators box for completion.  Form will not be processed until payment is received

## 2023-10-13 ENCOUNTER — Encounter (HOSPITAL_COMMUNITY)
Admission: RE | Admit: 2023-10-13 | Discharge: 2023-10-13 | Disposition: A | Discharge status: Home / Self Care | Source: Ambulatory Visit | Attending: Urology | Admitting: Urology

## 2023-10-13 DIAGNOSIS — C61 Malignant neoplasm of prostate: Secondary | ICD-10-CM | POA: Diagnosis present

## 2023-10-13 MED ORDER — FLOTUFOLASTAT F 18 GALLIUM 296-5846 MBQ/ML IV SOLN
8.7000 | Freq: Once | INTRAVENOUS | Status: AC
  Administered 2023-10-13: 8.7 via INTRAVENOUS

## 2023-10-13 NOTE — Telephone Encounter (Signed)
[facility] handicap form ready

## 2023-11-15 NOTE — Progress Notes (Addendum)
 GU Location of Tumor / Histology: Prostate Ca  If Prostate Cancer, Gleason Score is (4 + 3) and PSA is (18.00 on 09/07/2023)  Darrell Beard. presented as referral from Dr. Norleen Seltzer [facility]) elevated PSA.  Biopsies      10/13/2023 Dr. Norleen Seltzer NM PET (PSMA) Skull to Mid Thigh CLINICAL DATA: Newly diagnosed prostate carcinoma.   IMPRESSION: Focal intense radio tracer accumulation in the right posterior base of the prostate, consistent with primary prostate carcinoma. No evidence of metastatic disease.  3.5 cm infrarenal abdominal aortic aneurysm. Abdominal aortic aneurysm measuring 3.5 cm. Recommend surveillance ultrasound in 3 years. Reference: Journal of Vascular Surgery 67.1 (2018): 2-77. J Am Coll Radiol [phone].   08/16/2023 Dr. Norleen Seltzer MR Prostate with/without Contrast CLINICAL DATA:  Elevated PSA level, R97.20. Biopsy 06/03/2021 revealed high-grade PIN at the left base.  IMPRESSION: 1. No focal lesion of intermediate or higher suspicion for prostate cancer is identified. 2. Benign prostatic hypertrophy and mild prostatomegaly. 3. Sigmoid colon diverticulosis. 4. Degenerative left facet arthropathy at L5-S1.     Past/Anticipated interventions by urology, if any: (Prostatectomy scheduled) September 18, Dr. Gretel Ferrara  Past/Anticipated interventions by medical oncology, if any: NA  Weight changes, if any:  No  IPSS:  11 SHIM:  22  Bowel/Bladder complaints, if any:  No  Nausea/Vomiting, if any: No  Pain issues, if any:  0/10  SAFETY ISSUES: Prior radiation? No Pacemaker/ICD? No Possible current pregnancy? Male Is the patient on methotrexate? No  Current Complaints / other details:  None  Spent 35 minutes with patient.

## 2023-11-16 ENCOUNTER — Other Ambulatory Visit: Payer: Self-pay | Admitting: Urology

## 2023-11-18 ENCOUNTER — Encounter: Payer: Self-pay | Admitting: Urology

## 2023-11-18 DIAGNOSIS — C61 Malignant neoplasm of prostate: Secondary | ICD-10-CM | POA: Insufficient documentation

## 2023-11-18 NOTE — Progress Notes (Signed)
 Radiation Oncology         [phone]) [phone] ________________________________  Initial Outpatient Consultation  Name: [name]. MRN: [id number]  Date: 11/21/2023  DOB: Sep 06, 1950  RR:Dujrxd, Butler, MD  Watt Rush, MD   REFERRING PHYSICIAN: Watt Rush, MD  DIAGNOSIS: 73 y.o. gentleman with Stage T1c adenocarcinoma of the prostate with Gleason score of 4+3, and PSA of 18.    ICD-10-CM   1. Malignant neoplasm of prostate (HCC)  C61     2. Infrarenal abdominal aortic aneurysm (AAA) without rupture (HCC)  I71.43    Seen on PSMA PET 10/13/23 Rec'd survellanc ultrasound in 3 years    3. Adenoma of left adrenal gland  D35.02    Seen on PSMA PET 10/13/23    4. Aortic calcification (HCC)  I70.0    Seen on PSMA PET 10/13/23    5. Calcification of right carotid artery  I65.21    Seen on PSMA PET 10/13/23      HISTORY OF PRESENT ILLNESS: Darrell Beard. is a 73 y.o. male with a diagnosis of prostate cancer. He has a longstanding history of elevated PSA since at least 2019.  He had a prior negative prostate biopsy on 02/02/2018 when his PSA was 5.45.  An MRI of the prostate in December 2021 did not show any evidence of high-grade lesions.  The PSA increased to 8.66 in May 2022 and an ExoDx test at that time indicated low risk.  The PSA increased to 10.2 in December 2022 so a repeat prostate MRI was performed on 05/26/2021, again negative for any high risk lesions.  He had a repeat prostate biopsy 06/03/2021 that again was negative for malignancy.  The PSA continued to increase up to 14.3 in November 2024 and 17.7 in February 2025.  A repeat MRI of the prostate was performed on 08/16/2023, again without evidence of high-grade lesion.  A repeat transrectal ultrasound with 14 biopsies of the prostate was performed on 09/27/2023 with additional sampling of the transitional zone bilaterally.  The prostate volume measured 34 cc.  Out of 14 core biopsies, 1 was positive.  The maximum Gleason score was 4+3,  and this was seen in the right base.  Both samples from the transitional zone were negative for malignancy.    A PSMA PET scan was performed on 10/13/2023 to complete disease staging and this showed intense activity in the right posterior base of the prostate, consistent with primary prostate carcinoma.  There appears to be more disease within the prostate than what was found on the biopsy but, fortunately, there were no findings to suggest metastatic disease.    The patient reviewed the biopsy and imaging results with his urologist and he has kindly been referred today for discussion of potential radiation treatment options.  He also met with Dr. Renda on 11/08/2023 to discuss his surgical options and at that time, was leaning towards proceeding with robotic assisted laparoscopic prostatectomy, tentatively scheduled for 01/19/2024.   PREVIOUS RADIATION THERAPY: No  PAST MEDICAL HISTORY:  Past Medical History:  Diagnosis Date   Allergy    Elevated PSA    Hypertension    Prostate cancer (HCC)       PAST SURGICAL HISTORY: Past Surgical History:  Procedure Laterality Date   APPENDECTOMY     JOINT REPLACEMENT     left knee   KNEE SURGERY Bilateral    PROSTATE BIOPSY     TONSILLECTOMY      FAMILY HISTORY:  Family History  Problem Relation Age of Onset   Cancer Father        lung    SOCIAL HISTORY:  Social History   Socioeconomic History   Marital status: Married    Spouse name: Not on file   Number of children: Not on file   Years of education: Not on file   Highest education level: 12th grade  Occupational History   Not on file  Tobacco Use   Smoking status: Every Day    Current packs/day: 1.00    Average packs/day: 1 pack/day for 35.0 years (35.0 ttl pk-yrs)    Types: Cigarettes   Smokeless tobacco: Never  Vaping Use   Vaping status: Never Used  Substance and Sexual Activity   Alcohol use: Not Currently    Comment: occ   Drug use: No   Sexual activity: Not on  file  Other Topics Concern   Not on file  Social History Narrative   Not on file   Social Drivers of Health   Financial Resource Strain: Low Risk  (03/09/2023)   Overall Financial Resource Strain (CARDIA)    Difficulty of Paying Living Expenses: Not hard at all  Food Insecurity: No Food Insecurity (11/21/2023)   Hunger Vital Sign    Worried About Running Out of Food in the Last Year: Never true    Ran Out of Food in the Last Year: Never true  Transportation Needs: No Transportation Needs (11/21/2023)   PRAPARE - Administrator, Civil Service (Medical): No    Lack of Transportation (Non-Medical): No  Physical Activity: Unknown (03/09/2023)   Exercise Vital Sign    Days of Exercise per Week: 0 days    Minutes of Exercise per Session: Not on file  Stress: No Stress Concern Present (03/09/2023)   [employer] of Occupational Health - Occupational Stress Questionnaire    Feeling of Stress : Not at all  Social Connections: Unknown (03/09/2023)   Social Connection and Isolation Panel    Frequency of Communication with Friends and Family: More than three times a week    Frequency of Social Gatherings with Friends and Family: More than three times a week    Attends Religious Services: Patient declined    Database administrator or Organizations: No    Attends Banker Meetings: Not on file    Marital Status: Married  Catering manager Violence: Not At Risk (11/21/2023)   Humiliation, Afraid, Rape, and Kick questionnaire    Fear of Current or Ex-Partner: No    Emotionally Abused: No    Physically Abused: No    Sexually Abused: No    ALLERGIES: Penicillins  MEDICATIONS:  Current Outpatient Medications  Medication Sig Dispense Refill   latanoprost (XALATAN) 0.005 % ophthalmic solution SMARTSIG:In Eye(s)     amLODipine  (NORVASC ) 5 MG tablet Take 1 tablet (5 mg total) by mouth daily. 90 tablet 3   Vitamin D , Ergocalciferol , (DRISDOL ) 1.25 MG (50000 UNIT) CAPS  capsule Take 1 capsule by mouth once a week 13 capsule 3   No current facility-administered medications for this encounter.    REVIEW OF SYSTEMS:  On review of systems, the patient reports that he is doing well overall. He denies any chest pain, shortness of breath, cough, fevers, chills, night sweats, unintended weight changes. He denies any bowel disturbances, and denies abdominal pain, nausea or vomiting. He denies any new musculoskeletal or joint aches or pains. His IPSS was Total Score: 11, indicating moderate urinary symptoms (Reference  0-7 mild, 8-19 moderate, 20-35 severe).  His SHIM: 22, indicating he has no erectile dysfunction (Reference - 22-25 None, 17-21 Mild, 8-16 Moderate, 1-7 Severe), although he has not been sexually active in a few years, since his wife was treated for anal cancer.. A complete review of systems is obtained and is otherwise negative.     PHYSICAL EXAM:  Wt Readings from Last 3 Encounters:  11/21/23 235 lb 8 oz (106.8 kg)  03/10/23 225 lb 12.8 oz (102.4 kg)  05/04/22 233 lb 9.6 oz (106 kg)   Temp Readings from Last 3 Encounters:  11/21/23 98.2 F (36.8 C) (Temporal)  03/10/23 98.2 F (36.8 C)  05/04/22 98 F (36.7 C)   BP Readings from Last 3 Encounters:  11/21/23 (!) 155/82  03/10/23 126/68  05/04/22 (!) 140/67   Pulse Readings from Last 3 Encounters:  11/21/23 (!) 115  03/10/23 95  05/04/22 88   Pain Assessment Pain Score: 0-No pain/10  In general this is a well appearing male in no acute distress. He's alert and oriented x4 and appropriate throughout the examination. Cardiopulmonary assessment is negative for acute distress, and he exhibits normal effort.     KPS = 100  100 - Normal; no complaints; no evidence of disease. 90   - Able to carry on normal activity; minor signs or symptoms of disease. 80   - Normal activity with effort; some signs or symptoms of disease. 72   - Cares for self; unable to carry on normal activity or to do  active work. 60   - Requires occasional assistance, but is able to care for most of his personal needs. 50   - Requires considerable assistance and frequent medical care. 40   - Disabled; requires special care and assistance. 30   - Severely disabled; hospital admission is indicated although death not imminent. 20   - Very sick; hospital admission necessary; active supportive treatment necessary. 10   - Moribund; fatal processes progressing rapidly. 0     - Dead  Karnofsky DA, Abelmann WH, Craver LS and Burchenal [facility] [phone]) The use of the nitrogen mustards in the palliative treatment of carcinoma: with particular reference to bronchogenic carcinoma Cancer 1 634-56  LABORATORY DATA:  Lab Results  Component Value Date   WBC 8.8 03/10/2023   HGB 17.6 03/10/2023   HCT 52.3 (H) 03/10/2023   MCV 96 03/10/2023   PLT 282 03/10/2023   Lab Results  Component Value Date   NA 142 03/10/2023   K 4.5 03/10/2023   CL 101 03/10/2023   CO2 26 03/10/2023   Lab Results  Component Value Date   ALT 18 03/10/2023   AST 20 03/10/2023   ALKPHOS 91 03/10/2023   BILITOT 0.4 03/10/2023     RADIOGRAPHY: No results found.    IMPRESSION/PLAN: 1. 73 y.o. gentleman with Stage T1c adenocarcinoma of the prostate with Gleason Score of 4+3, and PSA of 18. We discussed the patient's workup and outlined the nature of prostate cancer in this setting. The patient's T stage, Gleason's score, and PSA put him into the unfavorable intermediate risk group. Accordingly, he is eligible for a variety of potential treatment options including prostatectomy, brachytherapy or 5.5 weeks of external radiation +/- ADT. We discussed the available radiation techniques, and focused on the details and logistics of delivery. We discussed and outlined the risks, benefits, short and long-term effects associated with radiotherapy and compared and contrasted these with prostatectomy. We discussed the role of SpaceOAR gel  in reducing the  rectal toxicity associated with radiotherapy. We also detailed the role of ADT in the treatment of unfavorable intermediate risk prostate cancer and outlined the associated side effects that could be expected with this therapy.    The patient focused most of his questions and interest in robotic-assisted laparoscopic radical prostatectomy.  We discussed some of the potential advantages of surgery including surgical staging, the availability of salvage radiotherapy to the prostatic fossa, and the confidence associated with immediate biochemical response. We discussed some of the potential proven indications for postoperative radiotherapy including positive margins, extracapsular extension, and seminal vesicle involvement. We also talked about some of the other potential findings leading to a recommendation for radiotherapy including a non-zero postoperative PSA and positive lymph nodes. He appears to have a good understanding of his disease and our treatment recommendations which are of curative intent.  He was encouraged to ask questions that were answered to his stated satisfaction.  At the conclusion of our conversation, the patient is interested in moving forward with prostatectomy.  I personally spent 60 minutes in this encounter including chart review, reviewing radiological studies, meeting face-to-face with the patient, entering orders and completing documentation.   ------------------------------------------------   Donnice Barge, MD [facility]  Radiation Oncology Direct Dial: [phone]  Fax: [phone] [email or web address]  Skype  LinkedIn

## 2023-11-21 ENCOUNTER — Ambulatory Visit
Admission: RE | Admit: 2023-11-21 | Discharge: 2023-11-21 | Disposition: A | Discharge status: Home / Self Care | Source: Ambulatory Visit | Attending: Radiation Oncology | Admitting: Radiation Oncology

## 2023-11-21 ENCOUNTER — Encounter: Payer: Self-pay | Admitting: Radiation Oncology

## 2023-11-21 VITALS — BP 155/82 | HR 115 | Temp 98.2°F | Resp 18 | Ht 67.0 in | Wt 235.5 lb

## 2023-11-21 DIAGNOSIS — Z79899 Other long term (current) drug therapy: Secondary | ICD-10-CM | POA: Insufficient documentation

## 2023-11-21 DIAGNOSIS — I6521 Occlusion and stenosis of right carotid artery: Secondary | ICD-10-CM | POA: Insufficient documentation

## 2023-11-21 DIAGNOSIS — I7143 Infrarenal abdominal aortic aneurysm, without rupture: Secondary | ICD-10-CM | POA: Insufficient documentation

## 2023-11-21 DIAGNOSIS — I700 Atherosclerosis of aorta: Secondary | ICD-10-CM | POA: Insufficient documentation

## 2023-11-21 DIAGNOSIS — C61 Malignant neoplasm of prostate: Secondary | ICD-10-CM

## 2023-11-21 DIAGNOSIS — I714 Abdominal aortic aneurysm, without rupture, unspecified: Secondary | ICD-10-CM | POA: Insufficient documentation

## 2023-11-21 DIAGNOSIS — I10 Essential (primary) hypertension: Secondary | ICD-10-CM | POA: Diagnosis not present

## 2023-11-21 DIAGNOSIS — D3502 Benign neoplasm of left adrenal gland: Secondary | ICD-10-CM | POA: Diagnosis not present

## 2023-11-21 DIAGNOSIS — F17210 Nicotine dependence, cigarettes, uncomplicated: Secondary | ICD-10-CM | POA: Diagnosis not present

## 2023-11-21 DIAGNOSIS — I2510 Atherosclerotic heart disease of native coronary artery without angina pectoris: Secondary | ICD-10-CM | POA: Diagnosis not present

## 2023-11-21 DIAGNOSIS — Z801 Family history of malignant neoplasm of trachea, bronchus and lung: Secondary | ICD-10-CM | POA: Insufficient documentation

## 2023-11-21 HISTORY — DX: Elevated prostate specific antigen (PSA): R97.20

## 2023-11-21 HISTORY — DX: Allergy, unspecified, initial encounter: T78.40XA

## 2023-11-21 HISTORY — DX: Essential (primary) hypertension: I10

## 2023-11-21 HISTORY — DX: Malignant neoplasm of prostate: C61

## 2023-11-21 NOTE — Progress Notes (Signed)
 Introduced myself to the patient as the prostate nurse navigator.  No barriers to care identified at this time.  He is here to discuss his radiation treatment options. He is tentatively scheduled for surgery with Dr. Renda on 01/19/24.  I gave him my business card and asked him to call me with questions or concerns.  Verbalized understanding.

## 2023-11-24 ENCOUNTER — Telehealth: Admitting: Physician Assistant

## 2023-11-24 DIAGNOSIS — B9689 Other specified bacterial agents as the cause of diseases classified elsewhere: Secondary | ICD-10-CM | POA: Diagnosis not present

## 2023-11-24 DIAGNOSIS — J019 Acute sinusitis, unspecified: Secondary | ICD-10-CM

## 2023-11-24 MED ORDER — DOXYCYCLINE HYCLATE 100 MG PO TABS: 100.0000 mg | ORAL_TABLET | Freq: Two times a day (BID) | ORAL | 0 refills | Status: DC

## 2023-11-24 NOTE — Progress Notes (Signed)

## 2023-11-24 NOTE — Progress Notes (Signed)
 I have spent 5 minutes in review of e-visit questionnaire, review and updating patient chart, medical decision making and response to patient.   Piedad Climes, PA-C

## 2023-12-02 ENCOUNTER — Ambulatory Visit: Admitting: Radiation Oncology

## 2023-12-02 ENCOUNTER — Ambulatory Visit

## 2023-12-30 ENCOUNTER — Other Ambulatory Visit: Payer: Self-pay | Admitting: *Deleted

## 2023-12-30 MED ORDER — VITAMIN D (ERGOCALCIFEROL) 1.25 MG (50000 UNIT) PO CAPS: 50000.0000 [IU] | ORAL_CAPSULE | ORAL | 3 refills | Status: DC

## 2024-01-12 NOTE — Progress Notes (Signed)
 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 patient aware  Sleep Study - N/A CPAP -   Fasting Blood Sugar - N/A Checks Blood Sugar _____ times a day  Last dose of GLP1 agonist-  N/A GLP1 instructions:  Do not take after     Last dose of SGLT-2 inhibitors-  N/A SGLT-2 instructions:  Do not take after     Blood Thinner Instructions: N/A Last dose:   Time: Aspirin Instructions:N/A Last Dose:  Activity level: Can go up a flight of stairs and perform activities of daily living without stopping and without symptoms of chest pain or shortness of breath.  Anesthesia review: N/A  Patient denies shortness of breath, fever, cough and chest pain at PAT appointment  Patient verbalized understanding of instructions that were given to them at the PAT appointment. Patient was also instructed that they will need to review over the PAT instructions again at home before surgery.

## 2024-01-12 NOTE — Patient Instructions (Signed)
 SURGICAL WAITING ROOM VISITATION  Patients having surgery or a procedure may have no more than 2 support people in the waiting area - these visitors may rotate.    Children under the age of 8 must have an adult with them who is not the patient.  Visitors with respiratory illnesses are discouraged from visiting and should remain at home.  If the patient needs to stay at the hospital during part of their recovery, the visitor guidelines for inpatient rooms apply. Pre-op nurse will coordinate an appropriate time for 1 support person to accompany patient in pre-op.  This support person may not rotate.    Please refer to the [facility] website for the visitor guidelines for Inpatients (after your surgery is over and you are in a regular room).    Your procedure is scheduled on: 01/19/24   Report to [facility] Main Entrance    Report to admitting at 9:00 AM   Call this number if you have problems the morning of surgery [phone]   Follow clear liquid diet the day before surgery.  Water Non-Citrus Juices (without pulp, NO RED-Apple, White grape, White cranberry) Black Coffee (NO MILK/CREAM OR CREAMERS, sugar ok)  Clear Tea (NO MILK/CREAM OR CREAMERS, sugar ok) regular and decaf                             Plain Jell-O (NO RED)                                           Fruit ices (not with fruit pulp, NO RED)                                     Popsicles (NO RED)                                                               Sports drinks like Gatorade (NO RED)  Nothing to drink after midnight.          If you have questions, please contact your surgeon's office.   FOLLOW BOWEL PREP AND ANY ADDITIONAL PRE OP INSTRUCTIONS YOU RECEIVED FROM YOUR SURGEON'S OFFICE!!!     Oral Hygiene is also important to reduce your risk of infection.                                    Remember - BRUSH YOUR TEETH THE MORNING OF SURGERY WITH YOUR REGULAR TOOTHPASTE  DENTURES WILL BE REMOVED  PRIOR TO SURGERY PLEASE DO NOT APPLY Poly grip OR ADHESIVES!!!   Do NOT smoke after Midnight   Stop all vitamins and herbal supplements 7 days before surgery.   Take these medicines the morning of surgery with A SIP OF WATER: Amlodipine                                You may not have any metal on your body  including jewelry, and body piercing             Do not wear lotions, powders, cologne, or deodorant              Men may shave face and neck.   Do not bring valuables to the hospital. [facility] IS NOT             RESPONSIBLE   FOR VALUABLES.   Contacts, glasses, dentures or bridgework may not be worn into surgery.   Bring small overnight bag day of surgery.   DO NOT BRING YOUR HOME MEDICATIONS TO THE HOSPITAL. PHARMACY WILL DISPENSE MEDICATIONS LISTED ON YOUR MEDICATION LIST TO YOU DURING YOUR ADMISSION IN THE HOSPITAL!              Please read over the following fact sheets you were given: IF YOU HAVE QUESTIONS ABOUT YOUR PRE-OP INSTRUCTIONS PLEASE CALL [phone].   If you received a COVID test during your pre-op visit  it is requested that you wear a mask when out in public, stay away from anyone that may not be feeling well and notify your surgeon if you develop symptoms. If you test positive for Covid or have been in contact with anyone that has tested positive in the last 10 days please notify you surgeon.    [facility] - Preparing for Surgery Before surgery, you can play an important role.  Because skin is not sterile, your skin needs to be as free of germs as possible.  You can reduce the number of germs on your skin by washing with CHG (chlorahexidine gluconate) soap before surgery.  CHG is an antiseptic cleaner which kills germs and bonds with the skin to continue killing germs even after washing. Please DO NOT use if you have an allergy to CHG or antibacterial soaps.  If your skin becomes reddened/irritated stop using the CHG and inform your nurse when you  arrive at Short Stay. Do not shave (including legs and underarms) for at least 48 hours prior to the first CHG shower.  You may shave your face/neck.  Please follow these instructions carefully:  1.  Shower with CHG Soap the night before surgery and the  morning of surgery.  2.  If you choose to wash your hair, wash your hair first as usual with your normal  shampoo.  3.  After you shampoo, rinse your hair and body thoroughly to remove the shampoo.                             4.  Use CHG as you would any other liquid soap.  You can apply chg directly to the skin and wash.  Gently with a scrungie or clean washcloth.  5.  Apply the CHG Soap to your body ONLY FROM THE NECK DOWN.   Do   not use on face/ open                           Wound or open sores. Avoid contact with eyes, ears mouth and   genitals (private parts).                       Wash face,  Genitals (private parts) with your normal soap.             6.  Wash thoroughly, paying special attention to the  area where your    surgery  will be performed.  7.  Thoroughly rinse your body with warm water from the neck down.  8.  DO NOT shower/wash with your normal soap after using and rinsing off the CHG Soap.                9.  Pat yourself dry with a clean towel.            10.  Wear clean pajamas.            11.  Place clean sheets on your bed the night of your first shower and do not  sleep with pets. Day of Surgery : Do not apply any lotions/deodorants the morning of surgery.  Please wear clean clothes to the hospital/surgery center.  FAILURE TO FOLLOW THESE INSTRUCTIONS MAY RESULT IN THE CANCELLATION OF YOUR SURGERY  PATIENT SIGNATURE_________________________________  NURSE SIGNATURE__________________________________  ________________________________________________________________________ WHAT IS A BLOOD TRANSFUSION? Blood Transfusion Information  A transfusion is the replacement of blood or some of its parts. Blood is made up of  multiple cells which provide different functions. Red blood cells carry oxygen and are used for blood loss replacement. White blood cells fight against infection. Platelets control bleeding. Plasma helps clot blood. Other blood products are available for specialized needs, such as hemophilia or other clotting disorders. BEFORE THE TRANSFUSION  Who gives blood for transfusions?  Healthy volunteers who are fully evaluated to make sure their blood is safe. This is blood bank blood. Transfusion therapy is the safest it has ever been in the practice of medicine. Before blood is taken from a donor, a complete history is taken to make sure that person has no history of diseases nor engages in risky social behavior (examples are intravenous drug use or sexual activity with multiple partners). The donor's travel history is screened to minimize risk of transmitting infections, such as malaria. The donated blood is tested for signs of infectious diseases, such as HIV and hepatitis. The blood is then tested to be sure it is compatible with you in order to minimize the chance of a transfusion reaction. If you or a relative donates blood, this is often done in anticipation of surgery and is not appropriate for emergency situations. It takes many days to process the donated blood. RISKS AND COMPLICATIONS Although transfusion therapy is very safe and saves many lives, the main dangers of transfusion include:  Getting an infectious disease. Developing a transfusion reaction. This is an allergic reaction to something in the blood you were given. Every precaution is taken to prevent this. The decision to have a blood transfusion has been considered carefully by your caregiver before blood is given. Blood is not given unless the benefits outweigh the risks. AFTER THE TRANSFUSION Right after receiving a blood transfusion, you will usually feel much better and more energetic. This is especially true if your red blood  cells have gotten low (anemic). The transfusion raises the level of the red blood cells which carry oxygen, and this usually causes an energy increase. The nurse administering the transfusion will monitor you carefully for complications. HOME CARE INSTRUCTIONS  No special instructions are needed after a transfusion. You may find your energy is better. Speak with your caregiver about any limitations on activity for underlying diseases you may have. SEEK MEDICAL CARE IF:  Your condition is not improving after your transfusion. You develop redness or irritation at the intravenous (IV) site. SEEK IMMEDIATE MEDICAL CARE IF:  Any  of the following symptoms occur over the next 12 hours: Shaking chills. You have a temperature by mouth above 102 F (38.9 C), not controlled by medicine. Chest, back, or muscle pain. People around you feel you are not acting correctly or are confused. Shortness of breath or difficulty breathing. Dizziness and fainting. You get a rash or develop hives. You have a decrease in urine output. Your urine turns a dark color or changes to pink, red, or brown. Any of the following symptoms occur over the next 10 days: You have a temperature by mouth above 102 F (38.9 C), not controlled by medicine. Shortness of breath. Weakness after normal activity. The white part of the eye turns yellow (jaundice). You have a decrease in the amount of urine or are urinating less often. Your urine turns a dark color or changes to pink, red, or brown. Document Released: 04/16/2000 Document Revised: 07/12/2011 Document Reviewed: 12/04/2007 [facility] Patient Information 2014 [place], [place].  _______________________________________________________________________

## 2024-01-13 ENCOUNTER — Other Ambulatory Visit: Payer: Self-pay

## 2024-01-13 ENCOUNTER — Encounter (HOSPITAL_COMMUNITY): Payer: Self-pay

## 2024-01-13 ENCOUNTER — Encounter (HOSPITAL_COMMUNITY)
Admission: RE | Admit: 2024-01-13 | Discharge: 2024-01-13 | Disposition: A | Discharge status: Home / Self Care | Source: Ambulatory Visit | Attending: Urology | Admitting: Urology

## 2024-01-13 VITALS — BP 158/90 | HR 95 | Temp 97.8°F | Resp 18 | Ht 67.0 in | Wt 234.0 lb

## 2024-01-13 DIAGNOSIS — I10 Essential (primary) hypertension: Secondary | ICD-10-CM | POA: Diagnosis not present

## 2024-01-13 DIAGNOSIS — Z01818 Encounter for other preprocedural examination: Secondary | ICD-10-CM | POA: Diagnosis present

## 2024-01-13 HISTORY — DX: Unspecified osteoarthritis, unspecified site: M19.90

## 2024-01-13 LAB — CBC
HCT: 52.4 % — ABNORMAL HIGH (ref 39.0–52.0)
Hemoglobin: 16.9 g/dL (ref 13.0–17.0)
MCH: 31 pg (ref 26.0–34.0)
MCHC: 32.3 g/dL (ref 30.0–36.0)
MCV: 96 fL (ref 80.0–100.0)
Platelets: 230 K/uL (ref 150–400)
RBC: 5.46 MIL/uL (ref 4.22–5.81)
RDW: 13.5 % (ref 11.5–15.5)
WBC: 6.7 K/uL (ref 4.0–10.5)
nRBC: 0 % (ref 0.0–0.2)

## 2024-01-13 LAB — BASIC METABOLIC PANEL WITH GFR
Anion gap: 11 (ref 5–15)
BUN: 14 mg/dL (ref 8–23)
CO2: 26 mmol/L (ref 22–32)
Calcium: 9.2 mg/dL (ref 8.9–10.3)
Chloride: 102 mmol/L (ref 98–111)
Creatinine, Ser: 1.01 mg/dL (ref 0.61–1.24)
GFR, Estimated: >60 mL/min (ref >60)
Glucose, Bld: 133 mg/dL — ABNORMAL HIGH (ref 70–99)
Potassium: 4 mmol/L (ref 3.5–5.1)
Sodium: 140 mmol/L (ref 135–145)

## 2024-01-18 NOTE — H&P (Signed)
 Office Visit Report     01/10/2024   --------------------------------------------------------------------------------   Darrell Beard  MRN: [id number]  DOB: Sep 12, 1950, 73 year old Male  SSN:    PRIMARY CARE:  Butler Der, MD  PRIMARY CARE FAX:  [phone]  REFERRING:  Norleen Seltzer, MD  PROVIDER:  Norleen Seltzer, M.D.  TREATING:  Alan Clois Hammonds, [place]  LOCATION:  [facility] [phone]     --------------------------------------------------------------------------------   CC/HPI: Pt presents today for pre-operative history and physical exam in anticipation of RALP with BPLND by Dr. Renda on 01/19/24. He is doing well and is without complaint.   Pt denies F/C, HA, CP, SOB, N/V, diarrhea/constipation, back pain, flank pain, hematuria, and dysuria.    HX:    CC: Prostate Cancer   Physician requesting consult: Dr. Norleen Seltzer  PCP: Dr. Butler Der   Mr. Darrell Beard is a 73 year old gentleman who has a history of an elevated PSA s/p negative prostate biopsy initially in 2019. His PSA further increased to 8.52 in 2021 prompting an MRI of the prostate that was negative. His PSA further increased to 10.2 prompting an MRI in January 2023 that was benign but he proceeded with another biopsy in Feburary 2023 that was again benign. His PSA then further increased to 18.0 more recently. 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   Family history: None.   Imaging studies:  MRI (08/16/23) - No EPE, SVI, or LAD.  PSMA PET scan (10/13/23) - No metastatic disease. Uptake at right base. This does suggest higher volume disease and his biopsy suggest with approximately half of the right lobe involved.   PMH: He has a history of hypertension.  PSH: Open appendectomy at age 89.   TNM stage: cT1c N0 M0  PSA: 18.0  Gleason score: 4+3=7 (GG3)  Biopsy (09/27/23): 1/12 cores  positive  Left: Benign  Right: R base (30%, 4+3=7)  Prostate volume: 34 cc   Nomogram  OC disease: 32%  EPE: 63%  SVI: 10%  LNI: 14%  PFS (5 year, 10 year): 52%, 36%   Urinary function: IPSS is 8.  Erectile function: SHIM score is 2.     ALLERGIES: penicillian - Per pt about killed me    MEDICATIONS: amLODIPine  Bes+SyrSpend SF  levoFLOXacin  1 po 1 hour prior to the procedure  Vitamin D  (Ergocalciferol ) 1.25 MG (50000 UT) Capsule     GU PSH: Prostate Needle Biopsy - 09/27/2023, 2023, 2019       PSH Notes: Fx repair R Knee  L knee tear  Left knee replacement  cataract removal bilateral     NON-GU PSH: Appendectomy Cataract surgery, Bilateral Knee Arthroscopy/surgery, Left Surgical Pathology, Gross And Microscopic Examination For Prostate Needle - 09/27/2023, 2023, 2019 Visit Complexity (formerly GPC1X) - 10/19/2023, 09/06/2023, 06/22/2023     GU PMH: 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 I discussed options and don't believe he is a good candidate for surveillance with his PSA elevation and grade. I discussed RALP, EXRT and seeds and reviewed those options and risks in detail and gave him the handout on local therapy for prostate cancer that details the treatments and risks. I discussed SpaceOAR and fiducials. I discussed ADT which he  might require for 6 months if requested by radiation oncology. I reviewed the side effects of that treatment. I briefly discussed cryotherapy and HIFU. I will request he be seen in the [facility]. , - 10/19/2023 BPH w/LUTS, He has a small prostate and only mild LUTS so brachytherapy could be a good option if he prefers radiation therapy. - 10/19/2023, He has stable mild/mod LUTS. , - 09/06/2023, He has a benign exam and mild/mod LUTs. , - 06/22/2023, - 03/18/2023, He has mild/mod LUTS with an IPSS of 7. , - 2023,  his voiding symptom have improved. , - 2022, He has moderate LUTS with some worsening of symptoms but not much bother. , - 2022, He is voiding with mild LUTS with some urgency and nocturia. No treatment indicated. , - 2021 Nocturia - 10/19/2023, - 09/06/2023, - 06/22/2023 Elevated PSA - 09/27/2023, His PSA continued to rise but the MRI was negative. I will repeat a PSA today but go ahead and get him scheduled for a standard biopsy. I will need to take additional cores from the [facility]. I have reviewed the risks of bleeding, infection and voiding difficulty. Levaquin  sent and he will need Gentamycin as well. , - 09/06/2023, His PSA continues to rise and is now up to 17.7. He has had 2 negative biopsies but with the near doubling of the PSA since the last, I am going to get him set up for another MRIP and either a fusion or standard biopsy. Risks reviewed. Levaquin  sent. , - 06/22/2023, - 03/18/2023, His PSA is slightly increased. He has been thoroughly evaluated with no cancer found. I will just have him return in 73mo. If the PSA continues to rise, a PSMA PET could be useful. , - 2023, - 2023, His PSA continues to rise slowly. Despite the low risk prior MRI and the negative ExoDx, I am going to get him set up for a repeat MRI and will either do a MR fusion or standard biopsy depending on the results. I have reviewed the risks of bleeding, infection and voiding difficulty. Levaquin  sent. , - 2022, His PSA is up some from the last level but below the high and his exam is benign. I am going to get an ExoDx test and if that is positive, I will rebiopsy. If negative, I will get a repeat PSA in 6 months. , - 2022, His PSA is falling and the MRIP didn't show high risk lesions. I will have him return in 6 months with a PSA. , - 2021, - 2019, His PSA is 5.5 with a 12.4% f/t ratio. I will have him get a repeat with a week of abstinence from sex but will go ahead and schedule a biopsy should it remain elevated. I reviewed the risks of  bleeding, infection and voiding difficulty. , - 2019 Weak Urinary Stream - 03/18/2023, - 2023, - 2022 Urinary Urgency - 2022, - 2022, - 2021 BPH w/o LUTS, His exam is benign. - 2019    NON-GU PMH: Muscle weakness (generalized) - 12/08/2023, - 11/22/2023 Other muscle spasm - 12/08/2023 Hypertension    FAMILY HISTORY: 2 daughters - No Family History Lung Cancer - Father   SOCIAL HISTORY: Marital Status: Married Preferred Language: English; Race: White Current Smoking Status: Patient smokes occasionally.   Tobacco Use Assessment Completed: Used Tobacco in last 30 days? Does not use smokeless tobacco. Drinks 4 drinks per year. Types of alcohol consumed: Liquor.  Does not use drugs. Drinks 3 caffeinated drinks per day. Has  not had a blood transfusion. Patient's occupation Engineer, agricultural for a [employer]..     Notes: Smokes 1/2 ppd x 40 years trying to quit  ETOH very rare    REVIEW OF SYSTEMS:    GU Review Male:   Patient denies frequent urination, hard to postpone urination, burning/ pain with urination, get up at night to urinate, leakage of urine, stream starts and stops, trouble starting your stream, have to strain to urinate , erection problems, and penile pain.  Gastrointestinal (Upper):   Patient denies nausea, vomiting, and indigestion/ heartburn.  Gastrointestinal (Lower):   Patient denies constipation and diarrhea.  Constitutional:   Patient denies fever, night sweats, weight loss, and fatigue.  Skin:   Patient denies skin rash/ lesion and itching.  Eyes:   Patient denies blurred vision and double vision.  Ears/ Nose/ Throat:   Patient denies sore throat and sinus problems.  Hematologic/Lymphatic:   Patient denies swollen glands and easy bruising.  Cardiovascular:   Patient denies leg swelling and chest pains.  Respiratory:   Patient denies cough and shortness of breath.  Endocrine:   Patient denies excessive thirst.  Musculoskeletal:   Patient denies back  pain and joint pain.  Neurological:   Patient denies headaches and dizziness.  Psychologic:   Patient denies depression and anxiety.   VITAL SIGNS:      01/10/2024 02:44 PM  BP 154/75 mmHg  Pulse 92 /min  Temperature 98.4 F / 36.8 C   MULTI-SYSTEM PHYSICAL EXAMINATION:    Constitutional: Well-nourished. No physical deformities. Normally developed. Good grooming.  Neck: Neck symmetrical, not swollen. Normal tracheal position.  Respiratory: Rhonchi ins and exp bilateral. Exp wheeze bilateral bases. No labored breathing, no use of accessory muscles.   Cardiovascular: Regular rate and rhythm. No murmur, no gallop.   Lymphatic: No enlargement of neck, axillae, groin.  Skin: No paleness, no jaundice, no cyanosis. No lesion, no ulcer, no rash.  Neurologic / Psychiatric: Oriented to time, oriented to place, oriented to person. No depression, no anxiety, no agitation.  Gastrointestinal: No mass, no tenderness, no rigidity, obese abdomen.   Eyes: Normal conjunctivae. Normal eyelids.  Ears, Nose, Mouth, and Throat: Left ear no scars, no lesions, no masses. Right ear no scars, no lesions, no masses. Nose no scars, no lesions, no masses. Normal hearing. Normal lips.  Musculoskeletal: Normal gait and station of head and neck.     Complexity of Data:  Records Review:   Previous Patient Records  Urine Test Review:   Urinalysis   01/10/24  Urinalysis  Urine Appearance Clear   Urine Color Amber   Urine Glucose Neg mg/dL  Urine Bilirubin Neg mg/dL  Urine Ketones Neg mg/dL  Urine Specific Gravity 1.025   Urine Blood Neg ery/uL  Urine pH 5.5   Urine Protein Trace mg/dL  Urine Urobilinogen 1.0 mg/dL  Urine Nitrites Neg   Urine Leukocyte Esterase Neg leu/uL   PROCEDURES:          Urinalysis - 81003 Dipstick Dipstick Cont'd  Color: Amber Bilirubin: Neg mg/dL  Appearance: Clear Ketones: Neg mg/dL  Specific Gravity: 8.974 Blood: Neg ery/uL  pH: 5.5 Protein: Trace mg/dL  Glucose: Neg mg/dL  Urobilinogen: 1.0 mg/dL    Nitrites: Neg    Leukocyte Esterase: Neg leu/uL    ASSESSMENT:      ICD-10 Details  1 GU:   Prostate Cancer - C61    PLAN:           Schedule Return  Visit/Planned Activity: Keep Scheduled Appointment - Schedule Surgery          Document Letter(s):  Created for Patient: Clinical Summary         Notes:   There are no changes in the patients history or physical exam since last evaluation by Dr. Renda. Pt is scheduled to undergo RALP with BPLND on 01/19/24.   All pt's questions were answered to the best of my ability.          Next Appointment:      Next Appointment: 01/19/2024 11:15 AM    Appointment Type: Surgery     Location: [facility] [phone]    Provider: Gretel Renda, M.D.    Reason for Visit: WL/OBS RAPL LEV 2 AND BPLND WITH RESIDENT $500.00 due 09/11      * Signed by Alan Clois Hammonds, PA on 01/10/24 at 3:04 PM (EDT)*

## 2024-01-19 ENCOUNTER — Encounter (HOSPITAL_COMMUNITY): Payer: Self-pay | Admitting: Urology

## 2024-01-19 ENCOUNTER — Ambulatory Visit (HOSPITAL_COMMUNITY): Admitting: Anesthesiology

## 2024-01-19 ENCOUNTER — Encounter (HOSPITAL_COMMUNITY)
Admission: RE | Disposition: A | Discharge status: Home / Self Care | Payer: Self-pay | Source: Home / Self Care | Attending: Urology

## 2024-01-19 ENCOUNTER — Observation Stay (HOSPITAL_COMMUNITY)
Admission: RE | Admit: 2024-01-19 | Discharge: 2024-01-20 | Disposition: A | Discharge status: Home / Self Care | Attending: Urology | Admitting: Urology

## 2024-01-19 DIAGNOSIS — C61 Malignant neoplasm of prostate: Secondary | ICD-10-CM

## 2024-01-19 DIAGNOSIS — I10 Essential (primary) hypertension: Secondary | ICD-10-CM | POA: Diagnosis not present

## 2024-01-19 DIAGNOSIS — Z79899 Other long term (current) drug therapy: Secondary | ICD-10-CM | POA: Insufficient documentation

## 2024-01-19 DIAGNOSIS — F17200 Nicotine dependence, unspecified, uncomplicated: Secondary | ICD-10-CM | POA: Insufficient documentation

## 2024-01-19 DIAGNOSIS — F1090 Alcohol use, unspecified, uncomplicated: Secondary | ICD-10-CM | POA: Insufficient documentation

## 2024-01-19 LAB — HEMOGLOBIN AND HEMATOCRIT, BLOOD
HCT: 48.4 % (ref 39.0–52.0)
Hemoglobin: 16 g/dL (ref 13.0–17.0)

## 2024-01-19 LAB — TYPE AND SCREEN
ABO/RH(D): A POS
Antibody Screen: NEGATIVE

## 2024-01-19 LAB — ABO/RH: ABO/RH(D): A POS

## 2024-01-19 SURGERY — PROSTATECTOMY, RADICAL, ROBOT-ASSISTED, LAPAROSCOPIC
Anesthesia: General | Site: Pelvis

## 2024-01-19 MED ORDER — ONDANSETRON HCL 4 MG/2ML IJ SOLN: 4.0000 mg | INTRAMUSCULAR | Status: DC | PRN

## 2024-01-19 MED ORDER — ROCURONIUM BROMIDE 10 MG/ML (PF) SYRINGE
PREFILLED_SYRINGE | INTRAVENOUS | Status: AC
Start: 2024-01-19 — End: 2024-01-19
  Filled 2024-01-19: qty 10

## 2024-01-19 MED ORDER — ACETAMINOPHEN 10 MG/ML IV SOLN: 1000.0000 mg | Freq: Once | INTRAVENOUS | Status: DC | PRN

## 2024-01-19 MED ORDER — ACETAMINOPHEN 325 MG PO TABS: 650.0000 mg | ORAL_TABLET | ORAL | Status: DC | PRN

## 2024-01-19 MED ORDER — PROPOFOL 1000 MG/100ML IV EMUL
INTRAVENOUS | Status: AC
Start: 2024-01-19 — End: 2024-01-19
  Filled 2024-01-19: qty 100

## 2024-01-19 MED ORDER — ZOLPIDEM TARTRATE 5 MG PO TABS: 5.0000 mg | ORAL_TABLET | Freq: Every evening | ORAL | Status: DC | PRN

## 2024-01-19 MED ORDER — PHENYLEPHRINE HCL-NACL 20-0.9 MG/250ML-% IV SOLN
INTRAVENOUS | Status: AC
  Filled 2024-01-19: qty 250

## 2024-01-19 MED ORDER — ACETAMINOPHEN 10 MG/ML IV SOLN
INTRAVENOUS | Status: AC
  Filled 2024-01-19: qty 100

## 2024-01-19 MED ORDER — ONDANSETRON HCL 4 MG/2ML IJ SOLN
INTRAMUSCULAR | Status: DC | PRN
  Administered 2024-01-19: 4 mg via INTRAVENOUS

## 2024-01-19 MED ORDER — FENTANYL CITRATE (PF) 100 MCG/2ML IJ SOLN
INTRAMUSCULAR | Status: AC
  Filled 2024-01-19: qty 2

## 2024-01-19 MED ORDER — DIPHENHYDRAMINE HCL 12.5 MG/5ML PO ELIX: 12.5000 mg | ORAL_SOLUTION | Freq: Four times a day (QID) | ORAL | Status: DC | PRN

## 2024-01-19 MED ORDER — PROPOFOL 1000 MG/100ML IV EMUL
INTRAVENOUS | Status: AC
  Filled 2024-01-19: qty 100

## 2024-01-19 MED ORDER — CEFAZOLIN SODIUM-DEXTROSE 1-4 GM/50ML-% IV SOLN
1.0000 g | Freq: Three times a day (TID) | INTRAVENOUS | Status: AC
  Administered 2024-01-19 – 2024-01-20 (×2): 1 g via INTRAVENOUS
  Filled 2024-01-19 (×2): qty 50

## 2024-01-19 MED ORDER — GLYCOPYRROLATE 0.2 MG/ML IJ SOLN
INTRAMUSCULAR | Status: AC
  Filled 2024-01-19: qty 1

## 2024-01-19 MED ORDER — CEFAZOLIN SODIUM-DEXTROSE 2-4 GM/100ML-% IV SOLN
INTRAVENOUS | Status: AC
  Filled 2024-01-19: qty 100

## 2024-01-19 MED ORDER — ACETAMINOPHEN 500 MG PO TABS
1000.0000 mg | ORAL_TABLET | Freq: Once | ORAL | Status: DC
Start: 2024-01-19 — End: 2024-01-19

## 2024-01-19 MED ORDER — FENTANYL CITRATE (PF) 100 MCG/2ML IJ SOLN
INTRAMUSCULAR | Status: DC | PRN
  Administered 2024-01-19: 50 ug via INTRAVENOUS

## 2024-01-19 MED ORDER — KETOROLAC TROMETHAMINE 15 MG/ML IJ SOLN
15.0000 mg | Freq: Four times a day (QID) | INTRAMUSCULAR | Status: DC
Start: 2024-01-19 — End: 2024-01-21
  Administered 2024-01-19 – 2024-01-20 (×2): 15 mg via INTRAVENOUS
  Filled 2024-01-19 (×2): qty 1

## 2024-01-19 MED ORDER — MORPHINE SULFATE (PF) 2 MG/ML IV SOLN: 2.0000 mg | INTRAVENOUS | Status: DC | PRN

## 2024-01-19 MED ORDER — DEXMEDETOMIDINE HCL IN NACL 80 MCG/20ML IV SOLN: INTRAVENOUS | Status: DC | PRN

## 2024-01-19 MED ORDER — CEFAZOLIN SODIUM 1 G IJ SOLR
INTRAMUSCULAR | Status: AC
  Filled 2024-01-19: qty 20

## 2024-01-19 MED ORDER — HYOSCYAMINE SULFATE 0.125 MG SL SUBL: 0.1250 mg | SUBLINGUAL_TABLET | Freq: Four times a day (QID) | SUBLINGUAL | Status: DC | PRN

## 2024-01-19 MED ORDER — LACTATED RINGERS IV SOLN
INTRAVENOUS | Status: DC | PRN
  Administered 2024-01-19: 500 mL

## 2024-01-19 MED ORDER — CHLORHEXIDINE GLUCONATE 0.12 % MT SOLN: 15.0000 mL | Freq: Once | OROMUCOSAL | Status: DC

## 2024-01-19 MED ORDER — DROPERIDOL 2.5 MG/ML IJ SOLN: 0.6250 mg | Freq: Once | INTRAMUSCULAR | Status: DC | PRN

## 2024-01-19 MED ORDER — DEXAMETHASONE SODIUM PHOSPHATE 10 MG/ML IJ SOLN
INTRAMUSCULAR | Status: AC
  Filled 2024-01-19: qty 1

## 2024-01-19 MED ORDER — GLYCOPYRROLATE 0.2 MG/ML IJ SOLN
INTRAMUSCULAR | Status: DC | PRN
  Administered 2024-01-19: .2 mg via INTRAVENOUS

## 2024-01-19 MED ORDER — ROCURONIUM BROMIDE 10 MG/ML (PF) SYRINGE
PREFILLED_SYRINGE | INTRAVENOUS | Status: DC | PRN
  Administered 2024-01-19 (×3): 20 mg via INTRAVENOUS
  Administered 2024-01-19: 10 mg via INTRAVENOUS
  Administered 2024-01-19: 70 mg via INTRAVENOUS

## 2024-01-19 MED ORDER — DIPHENHYDRAMINE HCL 50 MG/ML IJ SOLN: 12.5000 mg | Freq: Four times a day (QID) | INTRAMUSCULAR | Status: DC | PRN

## 2024-01-19 MED ORDER — BUPIVACAINE-EPINEPHRINE 0.25% -1:200000 IJ SOLN
INTRAMUSCULAR | Status: DC | PRN
  Administered 2024-01-19: 12 mL
  Administered 2024-01-19: 18 mL

## 2024-01-19 MED ORDER — VANCOMYCIN HCL 1000 MG IV SOLR
1000.0000 mg | INTRAVENOUS | Status: DC
  Filled 2024-01-19: qty 20

## 2024-01-19 MED ORDER — FLEET ENEMA RE ENEM: 1.0000 | ENEMA | Freq: Once | RECTAL | Status: DC

## 2024-01-19 MED ORDER — MAGNESIUM CITRATE PO SOLN: 1.0000 | Freq: Once | ORAL | Status: DC

## 2024-01-19 MED ORDER — SUGAMMADEX SODIUM 200 MG/2ML IV SOLN
INTRAVENOUS | Status: DC | PRN
  Administered 2024-01-19: 200 mg via INTRAVENOUS

## 2024-01-19 MED ORDER — FENTANYL CITRATE PF 50 MCG/ML IJ SOSY: 25.0000 ug | PREFILLED_SYRINGE | INTRAMUSCULAR | Status: DC | PRN

## 2024-01-19 MED ORDER — KCL IN DEXTROSE-NACL 20-5-0.45 MEQ/L-%-% IV SOLN
INTRAVENOUS | Status: DC
  Filled 2024-01-19 (×3): qty 1000

## 2024-01-19 MED ORDER — OXYCODONE HCL 5 MG/5ML PO SOLN: 5.0000 mg | Freq: Once | ORAL | Status: DC | PRN

## 2024-01-19 MED ORDER — STERILE WATER FOR IRRIGATION IR SOLN
Status: DC | PRN
  Administered 2024-01-19: 1000 mL

## 2024-01-19 MED ORDER — PHENYLEPHRINE 80 MCG/ML (10ML) SYRINGE FOR IV PUSH (FOR BLOOD PRESSURE SUPPORT)
PREFILLED_SYRINGE | INTRAVENOUS | Status: AC
  Filled 2024-01-19: qty 10

## 2024-01-19 MED ORDER — LATANOPROST 0.005 % OP SOLN
1.0000 [drp] | Freq: Every day | OPHTHALMIC | Status: DC
Start: 2024-01-19 — End: 2024-01-20
  Administered 2024-01-19: 1 [drp] via OPHTHALMIC
  Filled 2024-01-19: qty 2.5

## 2024-01-19 MED ORDER — TRIPLE ANTIBIOTIC 3.5-400-5000 EX OINT: 1.0000 | TOPICAL_OINTMENT | Freq: Three times a day (TID) | CUTANEOUS | Status: DC | PRN

## 2024-01-19 MED ORDER — ESMOLOL HCL 100 MG/10ML IV SOLN
INTRAVENOUS | Status: AC
Start: 2024-01-19 — End: 2024-01-19
  Filled 2024-01-19: qty 10

## 2024-01-19 MED ORDER — AMLODIPINE BESYLATE 5 MG PO TABS
5.0000 mg | ORAL_TABLET | Freq: Every day | ORAL | Status: DC
  Administered 2024-01-19 – 2024-01-20 (×2): 5 mg via ORAL
  Filled 2024-01-19 (×2): qty 1

## 2024-01-19 MED ORDER — LACTATED RINGERS IV SOLN
INTRAVENOUS | Status: DC
Start: 2024-01-19 — End: 2024-01-19

## 2024-01-19 MED ORDER — OXYCODONE HCL 5 MG PO TABS: 5.0000 mg | ORAL_TABLET | Freq: Once | ORAL | Status: DC | PRN

## 2024-01-19 MED ORDER — VANCOMYCIN HCL IN DEXTROSE 1-5 GM/200ML-% IV SOLN
INTRAVENOUS | Status: AC
  Administered 2024-01-19: 1000 mg via INTRAVENOUS
  Filled 2024-01-19: qty 200

## 2024-01-19 MED ORDER — PHENYLEPHRINE HCL-NACL 20-0.9 MG/250ML-% IV SOLN
INTRAVENOUS | Status: DC | PRN
  Administered 2024-01-19: 25 ug/min via INTRAVENOUS
  Administered 2024-01-19: 50 ug/min via INTRAVENOUS

## 2024-01-19 MED ORDER — ONDANSETRON HCL 4 MG/2ML IJ SOLN
INTRAMUSCULAR | Status: AC
  Filled 2024-01-19: qty 2

## 2024-01-19 MED ORDER — ACETAMINOPHEN 10 MG/ML IV SOLN
INTRAVENOUS | Status: DC | PRN
Start: 2024-01-19 — End: 2024-01-19
  Administered 2024-01-19: 1000 mg via INTRAVENOUS

## 2024-01-19 MED ORDER — BUPIVACAINE-EPINEPHRINE (PF) 0.25% -1:200000 IJ SOLN
INTRAMUSCULAR | Status: AC
  Filled 2024-01-19: qty 30

## 2024-01-19 MED ORDER — DEXAMETHASONE SODIUM PHOSPHATE 10 MG/ML IJ SOLN
INTRAMUSCULAR | Status: DC | PRN
  Administered 2024-01-19: 10 mg via INTRAVENOUS

## 2024-01-19 MED ORDER — PHENYLEPHRINE 80 MCG/ML (10ML) SYRINGE FOR IV PUSH (FOR BLOOD PRESSURE SUPPORT)
PREFILLED_SYRINGE | INTRAVENOUS | Status: DC | PRN
  Administered 2024-01-19: 80 ug via INTRAVENOUS
  Administered 2024-01-19: 160 ug via INTRAVENOUS

## 2024-01-19 MED ORDER — ORAL CARE MOUTH RINSE
15.0000 mL | Freq: Once | OROMUCOSAL | Status: DC
Start: 2024-01-19 — End: 2024-01-19

## 2024-01-19 MED ORDER — PROPOFOL 10 MG/ML IV BOLUS
INTRAVENOUS | Status: AC
  Filled 2024-01-19: qty 20

## 2024-01-19 MED ORDER — CEFAZOLIN SODIUM-DEXTROSE 2-4 GM/100ML-% IV SOLN
2.0000 g | Freq: Once | INTRAVENOUS | Status: AC
  Administered 2024-01-19 (×2): 2 g via INTRAVENOUS

## 2024-01-19 MED ORDER — SODIUM CHLORIDE 0.9 % IV BOLUS
1000.0000 mL | Freq: Once | INTRAVENOUS | Status: AC
Start: 2024-01-19 — End: 2024-01-19
  Administered 2024-01-19: 1000 mL via INTRAVENOUS

## 2024-01-19 MED ORDER — ALBUTEROL SULFATE HFA 108 (90 BASE) MCG/ACT IN AERS
INHALATION_SPRAY | RESPIRATORY_TRACT | Status: DC | PRN
  Administered 2024-01-19: 6 via RESPIRATORY_TRACT
  Administered 2024-01-19: 2 via RESPIRATORY_TRACT

## 2024-01-19 MED ORDER — VANCOMYCIN HCL IN DEXTROSE 1-5 GM/200ML-% IV SOLN: 1000.0000 mg | INTRAVENOUS | Status: DC

## 2024-01-19 MED ORDER — DEXMEDETOMIDINE HCL IN NACL 80 MCG/20ML IV SOLN
INTRAVENOUS | Status: AC
  Filled 2024-01-19: qty 20

## 2024-01-19 MED ORDER — SUGAMMADEX SODIUM 200 MG/2ML IV SOLN
INTRAVENOUS | Status: AC
  Filled 2024-01-19: qty 2

## 2024-01-19 MED ORDER — MIDAZOLAM HCL 2 MG/2ML IJ SOLN
INTRAMUSCULAR | Status: AC
  Filled 2024-01-19: qty 2

## 2024-01-19 MED ORDER — PROPOFOL 500 MG/50ML IV EMUL
INTRAVENOUS | Status: DC | PRN
  Administered 2024-01-19: 150 mg via INTRAVENOUS
  Administered 2024-01-19: 50 mg via INTRAVENOUS
  Administered 2024-01-19: 150 ug/kg/min via INTRAVENOUS

## 2024-01-19 MED ORDER — DOCUSATE SODIUM 100 MG PO CAPS
100.0000 mg | ORAL_CAPSULE | Freq: Two times a day (BID) | ORAL | Status: DC
  Administered 2024-01-19 – 2024-01-20 (×2): 100 mg via ORAL
  Filled 2024-01-19 (×2): qty 1

## 2024-01-19 MED ORDER — DEXMEDETOMIDINE HCL IN NACL 80 MCG/20ML IV SOLN
INTRAVENOUS | Status: DC | PRN
  Administered 2024-01-19: 8 ug via INTRAVENOUS

## 2024-01-19 MED ORDER — HEPARIN SODIUM (PORCINE) 1000 UNIT/ML IJ SOLN
INTRAMUSCULAR | Status: AC
Start: 2024-01-19 — End: 2024-01-19
  Filled 2024-01-19: qty 1

## 2024-01-19 SURGICAL SUPPLY — 53 items
APPLICATOR COTTON TIP 6 STRL (MISCELLANEOUS) ×2 IMPLANT
BAG COUNTER SPONGE SURGICOUNT (BAG) IMPLANT
CATH FOLEY 2WAY SLVR 18FR 30CC (CATHETERS) ×2 IMPLANT
CATH ROBINSON RED A/P 16FR (CATHETERS) ×2 IMPLANT
CATH ROBINSON RED A/P 8FR (CATHETERS) ×2 IMPLANT
CATH TIEMANN FOLEY 18FR 5CC (CATHETERS) ×2 IMPLANT
CHLORAPREP W/TINT 26 (MISCELLANEOUS) ×2 IMPLANT
CLIP LIGATING HEM O LOK PURPLE (MISCELLANEOUS) ×2 IMPLANT
COVER SURGICAL LIGHT HANDLE (MISCELLANEOUS) ×2 IMPLANT
COVER TIP SHEARS 8 DVNC (MISCELLANEOUS) ×2 IMPLANT
CUTTER ECHEON FLEX ENDO 45 340 (ENDOMECHANICALS) ×2 IMPLANT
DERMABOND ADVANCED .7 DNX12 (GAUZE/BANDAGES/DRESSINGS) ×2 IMPLANT
DRAPE ARM DVNC X/XI (DISPOSABLE) ×8 IMPLANT
DRAPE COLUMN DVNC XI (DISPOSABLE) ×2 IMPLANT
DRAPE SURG IRRIG POUCH 19X23 (DRAPES) ×2 IMPLANT
DRIVER NDL LRG 8 DVNC XI (INSTRUMENTS) ×4 IMPLANT
DRIVER NDLE LRG 8 DVNC XI (INSTRUMENTS) ×4 IMPLANT
DRSG TEGADERM 4X4.75 (GAUZE/BANDAGES/DRESSINGS) ×2 IMPLANT
ELECT PENCIL ROCKER SW 15FT (MISCELLANEOUS) ×2 IMPLANT
ELECT REM PT RETURN 15FT ADLT (MISCELLANEOUS) ×2 IMPLANT
FORCEPS BPLR LNG DVNC XI (INSTRUMENTS) ×2 IMPLANT
FORCEPS PROGRASP DVNC XI (FORCEP) ×2 IMPLANT
GAUZE SPONGE 4X4 12PLY STRL (GAUZE/BANDAGES/DRESSINGS) ×2 IMPLANT
GLOVE BIO SURGEON STRL SZ 6.5 (GLOVE) ×2 IMPLANT
GLOVE SURG LX STRL 7.5 STRW (GLOVE) ×4 IMPLANT
GOWN STRL REUS W/ TWL XL LVL3 (GOWN DISPOSABLE) ×4 IMPLANT
GOWN STRL SURGICAL XL XLNG (GOWN DISPOSABLE) ×2 IMPLANT
HOLDER FOLEY CATH W/STRAP (MISCELLANEOUS) ×2 IMPLANT
IRRIGATION SUCT STRKRFLW 2 WTP (MISCELLANEOUS) ×2 IMPLANT
IV LACTATED RINGERS 1000ML (IV SOLUTION) ×2 IMPLANT
KIT TURNOVER KIT A (KITS) ×2 IMPLANT
NDL SAFETY ECLIPSE 18X1.5 (NEEDLE) IMPLANT
PACK ROBOT UROLOGY CUSTOM (CUSTOM PROCEDURE TRAY) ×2 IMPLANT
PLUG CATH AND CAP STRL 200 (CATHETERS) ×2 IMPLANT
RELOAD STAPLE 45 4.1 GRN THCK (STAPLE) ×2 IMPLANT
SCISSORS LAP 5X45 EPIX DISP (ENDOMECHANICALS) IMPLANT
SCISSORS MNPLR CVD DVNC XI (INSTRUMENTS) ×2 IMPLANT
SEAL UNIV 5-12 XI (MISCELLANEOUS) ×8 IMPLANT
SET TUBE SMOKE EVAC HIGH FLOW (TUBING) ×2 IMPLANT
SOL PREP POV-IOD 4OZ 10% (MISCELLANEOUS) ×2 IMPLANT
SOLUTION ELECTROSURG ANTI STCK (MISCELLANEOUS) ×2 IMPLANT
SPIKE FLUID TRANSFER (MISCELLANEOUS) ×2 IMPLANT
SUT ETHILON 3 0 PS 1 (SUTURE) ×2 IMPLANT
SUT MNCRL 3 0 RB1 (SUTURE) ×2 IMPLANT
SUT MNCRL 3 0 VIOLET RB1 (SUTURE) ×2 IMPLANT
SUT MNCRL AB 4-0 PS2 18 (SUTURE) ×4 IMPLANT
SUT PDS PLUS AB 0 CT-2 (SUTURE) ×4 IMPLANT
SUT VIC AB 0 CT1 27XBRD ANTBC (SUTURE) ×4 IMPLANT
SUT VIC AB 2-0 SH 27X BRD (SUTURE) ×2 IMPLANT
SUT VIC AB 3-0 SH 27X BRD (SUTURE) IMPLANT
SYR 27GX1/2 1ML LL SAFETY (SYRINGE) ×2 IMPLANT
TROCAR Z THREAD OPTICAL 12X100 (TROCAR) IMPLANT
WATER STERILE IRR 1000ML POUR (IV SOLUTION) ×2 IMPLANT

## 2024-01-19 NOTE — Transfer of Care (Signed)
 Immediate Anesthesia Transfer of Care Note  Patient: Darrell Beard.  Procedure(s) Performed: PROSTATECTOMY, RADICAL, ROBOT-ASSISTED, LAPAROSCOPIC (Pelvis) LYMPHADENECTOMY, PELVIS, ROBOT-ASSISTED (Bilateral: Pelvis)  Patient Location: PACU  Anesthesia Type:General  Level of Consciousness: awake, drowsy, and patient cooperative  Airway & Oxygen Therapy: Patient Spontanous Breathing and Patient connected to face mask oxygen  Post-op Assessment: Report given to RN and Post -op Vital signs reviewed and stable  Post vital signs: Reviewed and stable  Last Vitals:  Vitals Value Taken Time  BP 133/89 01/19/24 14:45  Temp    Pulse 84 01/19/24 14:46  Resp 17 01/19/24 14:46  SpO2 100 % 01/19/24 14:46  Vitals shown include unfiled device data.  Last Pain:  Vitals:   01/19/24 0916  TempSrc: Oral         Complications: No notable events documented.

## 2024-01-19 NOTE — Anesthesia Postprocedure Evaluation (Signed)
 Anesthesia Post Note  Patient: Darrell Beard.  Procedure(s) Performed: PROSTATECTOMY, RADICAL, ROBOT-ASSISTED, LAPAROSCOPIC (Pelvis) LYMPHADENECTOMY, PELVIS, ROBOT-ASSISTED (Bilateral: Pelvis)     Patient location during evaluation: PACU Anesthesia Type: General Level of consciousness: awake and alert Pain management: pain level controlled Vital Signs Assessment: post-procedure vital signs reviewed and stable Respiratory status: spontaneous breathing, nonlabored ventilation, respiratory function stable and patient connected to nasal cannula oxygen Cardiovascular status: blood pressure returned to baseline and stable Postop Assessment: no apparent nausea or vomiting Anesthetic complications: no   No notable events documented.  Last Vitals:  Vitals:   01/19/24 1530 01/19/24 1545  BP: 134/77 (!) 143/84  Pulse: 96 95  Resp: 17 14  Temp:  36.4 C  SpO2: 93% 93%    Last Pain:  Vitals:   01/19/24 1545  TempSrc:   PainSc: 0-No pain                 [name]

## 2024-01-19 NOTE — Anesthesia Preprocedure Evaluation (Signed)
 Anesthesia Evaluation  Patient identified by MRN, date of birth, ID band Patient awake    Reviewed: Allergy & Precautions, NPO status , Patient's Chart, lab work & pertinent test results  History of Anesthesia Complications Negative for: history of anesthetic complications  Airway Mallampati: III  TM Distance: >3 FB Neck ROM: Full    Dental no notable dental hx. (+) Teeth Intact   Pulmonary neg sleep apnea, neg COPD, Current Smoker and Patient abstained from smoking.   Pulmonary exam normal breath sounds clear to auscultation       Cardiovascular Exercise Tolerance: Good METShypertension, Pt. on medications (-) CAD and (-) Past MI (-) dysrhythmias  Rhythm:Regular Rate:Normal - Systolic murmurs    Neuro/Psych negative neurological ROS  negative psych ROS   GI/Hepatic ,neg GERD  ,,(+)     (-) substance abuse    Endo/Other  neg diabetes    Renal/GU negative Renal ROS     Musculoskeletal   Abdominal  (+) + obese  Peds  Hematology   Anesthesia Other Findings Past Medical History: No date: Allergy No date: Arthritis No date: Elevated PSA No date: Hypertension No date: Prostate cancer (HCC)  Reproductive/Obstetrics                              Anesthesia Physical Anesthesia Plan  ASA: 2  Anesthesia Plan: General   Post-op Pain Management: Tylenol  PO (pre-op)*   Induction: Intravenous  PONV Risk Score and Plan: 3 and Ondansetron , Dexamethasone , Midazolam , TIVA and Propofol  infusion  Airway Management Planned: Oral ETT  Additional Equipment: None  Intra-op Plan:   Post-operative Plan: Extubation in OR  Informed Consent: I have reviewed the patients History and Physical, chart, labs and discussed the procedure including the risks, benefits and alternatives for the proposed anesthesia with the patient or authorized representative who has indicated his/her understanding and  acceptance.     Dental advisory given  Plan Discussed with: CRNA and Surgeon  Anesthesia Plan Comments: (Discussed risks of anesthesia with patient, including PONV, sore throat, lip/dental/eye damage. Rare risks discussed as well, such as cardiorespiratory and neurological sequelae, and allergic reactions. Discussed the role of CRNA in patient's perioperative care. Patient understands.  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 No/unknown Liver or kidney injury caused by PCN? No/unknown Hemolytic anemia from PCN? No/unknown Drug fever? No/unknown Painful swollen joints? No/unknown Severe reaction involving inside of mouth, eye, or genital ulcers? No/unknown Based on current evidence Zachary dunker al, J Allergy Clin Immunol Pract, 2019), will proceed with cefazolin  use: Yes  )         Anesthesia Quick Evaluation

## 2024-01-19 NOTE — Anesthesia Procedure Notes (Signed)
 Procedure Name: Intubation Date/Time: 01/19/2024 10:52 AM  Performed by: Nada Corean CROME, CRNAPre-anesthesia Checklist: Suction available, Emergency Drugs available, Patient identified, Patient being monitored and Timeout performed Patient Re-evaluated:Patient Re-evaluated prior to induction Oxygen Delivery Method: Circle system utilized Preoxygenation: Pre-oxygenation with 100% oxygen Induction Type: IV induction Ventilation: Oral airway inserted - appropriate to patient size and Mask ventilation without difficulty Laryngoscope Size: 4 Grade View: Grade IV Tube type: Oral Tube size: 7.5 mm Number of attempts: 1 Airway Equipment and Method: Stylet Placement Confirmation: positive ETCO2, ETT inserted through vocal cords under direct vision and breath sounds checked- equal and bilateral Secured at: 23 cm Tube secured with: Tape Dental Injury: Teeth and Oropharynx as per pre-operative assessment  Difficulty Due To: Difficult Airway- due to anterior larynx

## 2024-01-19 NOTE — Interval H&P Note (Signed)
 History and Physical Interval Note:  01/19/2024 10:03 AM  Darrell Beard.  has presented today for surgery, with the diagnosis of PROSTATE CANCER.  The various methods of treatment have been discussed with the patient and family. After consideration of risks, benefits and other options for treatment, the patient has consented to  Procedure(s) with comments: PROSTATECTOMY, RADICAL, ROBOT-ASSISTED, LAPAROSCOPIC (N/A) - LEVEL 2 LYMPHADENECTOMY, PELVIS, ROBOT-ASSISTED (Bilateral) as a surgical intervention.  The patient's history has been reviewed, patient examined, no change in status, stable for surgery.  I have reviewed the patient's chart and labs.  Questions were answered to the patient's satisfaction.     [employer]

## 2024-01-19 NOTE — Progress Notes (Signed)
 Patient ID: Darrell Beard., male   DOB: August 07, 1950, 73 y.o.   MRN: [id number]  Post-op note  Subjective: The patient is doing well.  No complaints.  Objective: Vital signs in last 24 hours: Temp:  [97.6 F (36.4 C)-97.8 F (36.6 C)] 97.6 F (36.4 C) (09/18 1545) Pulse Rate:  [80-107] 107 (09/18 1700) Resp:  [14-19] 16 (09/18 1700) BP: (133-168)/(63-101) 168/90 (09/18 1700) SpO2:  [91 %-100 %] 94 % (09/18 1700) Weight:  [106.1 kg] 106.1 kg (09/18 0916)  Intake/Output from previous day: No intake/output data recorded. Intake/Output this shift: Total I/O In: 2246.9 [I.V.:1946.9; IV Piggyback:300] Out: 1525 [Urine:750; Drains:75; Blood:700]  Physical Exam:  General: Alert and oriented. Abdomen: Soft, Nondistended. Incisions: Clean and dry. GU: Urine clear.  Lab Results: Recent Labs    01/19/24 1702  HGB 16.0  HCT 48.4    Assessment/Plan: POD#0   1) Continue to monitor, ambulate, IS   Gretel CANDIE Renda Raddle. MD   LOS: 0 days   Noretta Renda 01/19/2024, 5:25 PM

## 2024-01-19 NOTE — Op Note (Signed)
 Preoperative diagnosis: Clinically localized adenocarcinoma of the prostate (clinical stage T1c N0 M0)  Postoperative diagnosis: Clinically localized adenocarcinoma of the prostate (clinical stage T1c N0 M0)  Procedure:  Robotic assisted laparoscopic radical prostatectomy (bilateral nerve sparing) Bilateral robotic assisted laparoscopic pelvic lymphadenectomy  Surgeon: Darrell CANDIE Renda Mickey. M.D.  Resident: Dr. Lyle Civil  Anesthesia: General  Complications: None  EBL: 700 mL  IVF:  2300 mL crystalloid  Specimens: Prostate and seminal vesicles Right pelvic lymph nodes Left pelvic lymph nodes  Disposition of specimens: Pathology  Drains: 20 Fr coude catheter # 19 Blake pelvic drain  Indication: Darrell Beard. is a 73 y.o. year old patient with clinically localized prostate cancer.  After a thorough review of the management options for treatment of prostate cancer, he elected to proceed with surgical therapy and the above procedure(s).  We have discussed the potential benefits and risks of the procedure, side effects of the proposed treatment, the likelihood of the patient achieving the goals of the procedure, and any potential problems that might occur during the procedure or recuperation. Informed consent has been obtained.  Description of procedure:  The patient was taken to the operating room and a general anesthetic was administered. He was given preoperative antibiotics, placed in the dorsal lithotomy position, and prepped and draped in the usual sterile fashion. Next a preoperative timeout was performed. A urethral catheter was placed into the bladder and a site was selected near the umbilicus for placement of the camera port. This was placed using a standard open Hassan technique which allowed entry into the peritoneal cavity under direct vision and without difficulty. An 8 mm robotic port was placed and a pneumoperitoneum established. The camera was then used to  inspect the abdomen and there was no evidence of any intra-abdominal injuries or other abnormalities. The remaining abdominal ports were then placed. 8 mm robotic ports were placed in the right lower quadrant, left lower quadrant, and far left lateral abdominal wall. A 5 mm port was placed in the right upper quadrant and a 12 mm port was placed in the right lateral abdominal wall for laparoscopic assistance. All ports were placed under direct vision without difficulty. The surgical cart was then docked.   Utilizing the cautery scissors, the bladder was reflected posteriorly allowing entry into the space of Retzius and identification of the endopelvic fascia and prostate. The periprostatic fat was then removed from the prostate allowing full exposure of the endopelvic fascia. The endopelvic fascia was then incised from the apex back to the base of the prostate bilaterally and the underlying levator muscle fibers were swept laterally off the prostate thereby isolating the dorsal venous complex. The dorsal vein was then stapled and divided with a 45 mm Flex Echelon stapler. Attention then turned to the bladder neck which was divided anteriorly thereby allowing entry into the bladder and exposure of the urethral catheter. The catheter balloon was deflated and the catheter was brought into the operative field and used to retract the prostate anteriorly. The posterior bladder neck was then examined and was divided allowing further dissection between the bladder and prostate posteriorly until the vasa deferentia and seminal vessels were identified. The vasa deferentia were isolated, divided, and lifted anteriorly. The seminal vesicles were dissected down to their tips with care to control the seminal vascular arterial blood supply. These structures were then lifted anteriorly and the space between Denonvillier's fascia and the anterior rectum was developed with a combination of sharp and blunt dissection. This  isolated  the vascular pedicles of the prostate.  The lateral prostatic fascia was then sharply incised allowing release of the neurovascular bundles bilaterally. The vascular pedicles of the prostate were then ligated with Weck clips between the prostate and neurovascular bundles and divided with sharp cold scissor dissection resulting in neurovascular bundle preservation. The neurovascular bundles were then separated off the apex of the prostate and urethra bilaterally.  The urethra was then sharply transected allowing the prostate specimen to be disarticulated. There was some bleeding from the dorsal vein and this was controlled with a 2-0 vicryl figure of eight suture. The pelvis was copiously irrigated and hemostasis was ensured. There was no evidence for rectal injury.  Attention then turned to the right pelvic sidewall. The fibrofatty tissue between the external iliac vein, confluence of the iliac vessels, hypogastric artery, and Cooper's ligament was dissected free from the pelvic sidewall with care to preserve the obturator nerve. Weck clips were used for lymphostasis and hemostasis. An identical procedure was performed on the contralateral side and the lymphatic packets were removed for permanent pathologic analysis.  Attention then turned to the urethral anastomosis. A 2-0 Vicryl slip knot was placed between Denonvillier's fascia, the posterior bladder neck, and the posterior urethra to reapproximate these structures. A double-armed 3-0 Monocryl suture was then used to perform a 360 running tension-free anastomosis between the bladder neck and urethra. A new urethral catheter was then placed into the bladder and irrigated. There were no blood clots within the bladder and the anastomosis appeared to be watertight. A #19 Blake drain was then brought through the left lateral 8 mm port site and positioned appropriately within the pelvis. It was secured to the skin with a nylon suture. The surgical cart was  then undocked. The right lateral 12 mm port site was closed at the fascial level with a 0 Vicryl suture placed laparoscopically. All remaining ports were then removed under direct vision. The prostate specimen was removed intact within the Endopouch retrieval bag via the periumbilical camera port site. This fascial opening was closed with two running 0 PDS sutures. 0.25% Marcaine  was then injected into all port sites and all incisions were reapproximated at the skin level with 4-0 Monocryl subcuticular sutures and Dermabond. The patient appeared to tolerate the procedure well and without complications. The patient was able to be extubated and transferred to the recovery unit in satisfactory condition.   Darrell CANDIE Renda Teddie MD

## 2024-01-20 ENCOUNTER — Encounter (HOSPITAL_COMMUNITY): Payer: Self-pay | Admitting: Urology

## 2024-01-20 ENCOUNTER — Other Ambulatory Visit: Payer: Self-pay

## 2024-01-20 DIAGNOSIS — C61 Malignant neoplasm of prostate: Secondary | ICD-10-CM | POA: Diagnosis not present

## 2024-01-20 LAB — HEMOGLOBIN AND HEMATOCRIT, BLOOD
HCT: 44.3 % (ref 39.0–52.0)
Hemoglobin: 14.1 g/dL (ref 13.0–17.0)

## 2024-01-20 MED ORDER — CHLORHEXIDINE GLUCONATE CLOTH 2 % EX PADS
6.0000 | MEDICATED_PAD | Freq: Every day | CUTANEOUS | Status: DC
Start: 2024-01-20 — End: 2024-01-20
  Administered 2024-01-20: 6 via TOPICAL

## 2024-01-20 MED ORDER — ACETAMINOPHEN 500 MG PO TABS: 1000.0000 mg | ORAL_TABLET | Freq: Four times a day (QID) | ORAL | 0 refills | Status: AC | PRN

## 2024-01-20 MED ORDER — BISACODYL 10 MG RE SUPP
10.0000 mg | Freq: Once | RECTAL | Status: AC
  Administered 2024-01-20: 10 mg via RECTAL
  Filled 2024-01-20: qty 1

## 2024-01-20 MED ORDER — TRAMADOL HCL 50 MG PO TABS: 50.0000 mg | ORAL_TABLET | Freq: Four times a day (QID) | ORAL | Status: DC | PRN

## 2024-01-20 MED ORDER — SENNA 8.6 MG PO TABS: 1.0000 | ORAL_TABLET | Freq: Every day | ORAL | 0 refills | Status: AC

## 2024-01-20 MED ORDER — DOCUSATE SODIUM 100 MG PO CAPS: 100.0000 mg | ORAL_CAPSULE | Freq: Every day | ORAL | 0 refills | Status: DC | PRN

## 2024-01-20 MED ORDER — CELECOXIB 200 MG PO CAPS: 200.0000 mg | ORAL_CAPSULE | Freq: Every day | ORAL | 0 refills | Status: DC

## 2024-01-20 MED ORDER — SULFAMETHOXAZOLE-TRIMETHOPRIM 800-160 MG PO TABS: 1.0000 | ORAL_TABLET | Freq: Two times a day (BID) | ORAL | 0 refills | Status: AC

## 2024-01-20 MED ORDER — TRAMADOL HCL 50 MG PO TABS: 50.0000 mg | ORAL_TABLET | Freq: Four times a day (QID) | ORAL | 0 refills | Status: AC | PRN

## 2024-01-20 NOTE — Discharge Instructions (Signed)
Activity:  You are encouraged to ambulate frequently (about every hour during waking hours) to help prevent blood clots from forming in your legs or lungs.  However, you should not engage in any heavy lifting (> 10-15 lbs), strenuous activity, or straining. Diet: You should continue a clear liquid diet until passing gas from below.  Once this occurs, you may advance your diet to a soft diet that would be easy to digest (i.e soups, scrambled eggs, mashed potatoes, etc.) for 24 hours just as you would if getting over a bad stomach flu.  If tolerating this diet well for 24 hours, you may then begin eating regular food.  It will be normal to have some amount of bloating, nausea, and abdominal discomfort intermittently. Prescriptions:  You will be provided a prescription for pain medication to take as needed.  If your pain is not severe enough to require the prescription pain medication, you may take extra strength Tylenol instead.  You should also take an over the counter stool softener (Colace 100 mg twice daily) to avoid straining with bowel movements as the pain medication may constipate you. Finally, you will also be provided a prescription for an antibiotic to begin the day prior to your return visit in the office for catheter removal. Catheter care: You will be taught how to take care of the catheter by the nursing staff prior to discharge from the hospital.  You may use both a leg bag and the larger bedside bag but it is recommended to at least use the bigger bedside bag at nighttime as the leg bag is small and will fill up overnight and also does not drain as well when lying flat. You may periodically feel a strong urge to void with the catheter in place.  This is a bladder spasm and most often can occur when having a bowel movement or when you are moving around. It is typically self-limited and usually will stop after a few minutes.  You may use some Vaseline or Neosporin around the tip of the catheter to  reduce friction at the tip of the penis. Incisions: You may remove your dressing bandages the 2nd day after surgery.  You most likely will have a few small staples in each of the incisions and once the bandages are removed, the incisions may stay open to air.  You may start showering (not soaking or bathing in water) 48 hours after surgery and the incisions simply need to be patted dry after the shower.  No additional care is needed. What to call us about: You should call the office ([phone]) if you develop fever > 101, persistent vomiting, or the catheter stops draining. Also, feel free to call with any other questions you may have and remember the handout that was provided to you as a reference preoperatively which answers many of the common questions that arise after surgery.  

## 2024-01-20 NOTE — Progress Notes (Signed)
 1 Day Post-Op Subjective: The patient is doing well.  No nausea or vomiting. Pain is adequately controlled. Ambulating, tolerating clears. Some burping, no flatus. Adequate uop via Foley. Drain with s/s output of ~200ccs.   Objective: Vital signs in last 24 hours: Temp:  [97.6 F (36.4 C)-99 F (37.2 C)] 99 F (37.2 C) (09/19 0611) Pulse Rate:  [80-107] 96 (09/19 0611) Resp:  [14-19] 17 (09/19 0611) BP: (106-168)/(62-104) 127/68 (09/19 0611) SpO2:  [91 %-100 %] 94 % (09/19 0611) Weight:  [106.1 kg] 106.1 kg (09/18 0916)  Intake/Output from previous day: 09/18 0701 - 09/19 0700 In: 4664.5 [P.O.:480; I.V.:3784.5; IV Piggyback:400] Out: 3745 [Urine:2850; Drains:195; Blood:700] Intake/Output this shift: No intake/output data recorded.  Physical Exam:  General: Alert and oriented. CV: regular rate  Lungs: Clear bilaterally. GI: Soft, slightly distended. Minimally tender. Drain with s/s output Incisions: Clean, dry, and intact. Urine: Clear Extremities: Nontender, no erythema, no edema.  Lab Results: Recent Labs    01/19/24 1702 01/20/24 0402  HGB 16.0 14.1  HCT 48.4 44.3      Assessment/Plan: POD# 1 s/p robotic prostatectomy.  1) SL IVF 2) Ambulate, Incentive spirometry 3) Transition to oral pain medication 4) Bowel regimen  5) D/C pelvic drain 6) Plan for likely discharge later today    LOS: 0 days   Lyle LOISE Civil 01/20/2024, 7:09 AM

## 2024-01-20 NOTE — Progress Notes (Signed)
  DC instructions reviewed with patient and wife, questions answered, verbalized understanding.  Educated on how to switch to leg bag and then back to standard drainage bag.  Patient ambulatory to main entrance to be taken home by wife.

## 2024-01-20 NOTE — Discharge Summary (Signed)
 Date of admission: 01/19/2024  Date of discharge: 01/20/2024  Admission diagnosis: Prostate Cancer  Discharge diagnosis: Prostate Cancer  History and Physical: For full details, please see admission history and physical. Briefly, Darrell Beard. is a 73 y.o. gentleman with localized prostate cancer.  After discussing management/treatment options, he elected to proceed with surgical treatment.  Hospital Course: Darrell Beard. was taken to the operating room on 01/19/2024 and underwent a robotic assisted laparoscopic radical prostatectomy. He tolerated this procedure well and without complications. Postoperatively, he was able to be transferred to a regular hospital room following recovery from anesthesia.  He was able to begin ambulating the night of surgery. He remained hemodynamically stable overnight.  He had excellent urine output with appropriately minimal output from his pelvic drain and his pelvic drain was removed on POD #1.  He was transitioned to oral pain medication, tolerated a clear liquid diet, and had met all discharge criteria and was able to be discharged home later on POD#1.  Laboratory values:  Recent Labs    01/19/24 1702 01/20/24 0402  HGB 16.0 14.1  HCT 48.4 44.3    Disposition: Home  Discharge instruction: He was instructed to be ambulatory but to refrain from heavy lifting, strenuous activity, or driving. He was instructed on urethral catheter care.  Discharge medications:   Allergies as of 01/20/2024       Reactions   Penicillins Anaphylaxis   TOLERATED CEFAZOLIN  Given when he was a child and told that he almost died from penicillin        Medication List     STOP taking these medications    doxycycline  100 MG tablet Commonly known as: VIBRA -TABS   Vitamin D  (Ergocalciferol ) 1.25 MG (50000 UNIT) Caps capsule Commonly known as: DRISDOL        TAKE these medications    acetaminophen  500 MG tablet Commonly known as: TYLENOL  Take 2 tablets  (1,000 mg total) by mouth every 6 (six) hours as needed for up to 7 days.   amLODipine  5 MG tablet Commonly known as: NORVASC  Take 1 tablet (5 mg total) by mouth daily.   latanoprost  0.005 % ophthalmic solution Commonly known as: XALATAN  Place 1 drop into both eyes at bedtime.   senna 8.6 MG Tabs tablet Commonly known as: SENOKOT Take 1 tablet (8.6 mg total) by mouth daily for 7 days.   sulfamethoxazole -trimethoprim  800-160 MG tablet Commonly known as: BACTRIM  DS Take 1 tablet by mouth 2 (two) times daily. Start the day prior to foley removal appointment   traMADol  50 MG tablet Commonly known as: Ultram  Take 1-2 tablets (50-100 mg total) by mouth every 6 (six) hours as needed for moderate pain (pain score 4-6) or severe pain (pain score 7-10).        Followup: He will followup in 1 week for catheter removal and to discuss his surgical pathology results.

## 2024-01-20 NOTE — Progress Notes (Signed)
 Pt walked a full lap. 1000 feet. Pt was not winded or in distress. I have been encouraging the incentive spirometer.

## 2024-01-20 NOTE — Plan of Care (Signed)
   Problem: Education: Goal: Knowledge of the procedure and recovery process will improve Outcome: Progressing   Problem: Bowel/Gastric: Goal: Gastrointestinal status for postoperative course will improve Outcome: Progressing   Problem: Pain Management: Goal: General experience of comfort will improve Outcome: Progressing   Problem: Skin Integrity: Goal: Demonstration of wound healing without infection will improve Outcome: Progressing   Problem: Urinary Elimination: Goal: Ability to avoid or minimize complications of infection will improve Outcome: Progressing Goal: Ability to achieve and maintain urine output will improve Outcome: Progressing Goal: Home care management will improve Outcome: Progressing   Problem: Education: Goal: Knowledge of General Education information will improve Description: Including pain rating scale, medication(s)/side effects and non-pharmacologic comfort measures Outcome: Progressing   Problem: Health Behavior/Discharge Planning: Goal: Ability to manage health-related needs will improve Outcome: Progressing   Problem: Clinical Measurements: Goal: Ability to maintain clinical measurements within normal limits will improve Outcome: Progressing Goal: Will remain free from infection Outcome: Progressing Goal: Diagnostic test results will improve Outcome: Progressing Goal: Respiratory complications will improve Outcome: Progressing Goal: Cardiovascular complication will be avoided Outcome: Progressing   Problem: Activity: Goal: Risk for activity intolerance will decrease Outcome: Progressing   Problem: Nutrition: Goal: Adequate nutrition will be maintained Outcome: Progressing   Problem: Coping: Goal: Level of anxiety will decrease Outcome: Progressing   Problem: Elimination: Goal: Will not experience complications related to bowel motility Outcome: Progressing Goal: Will not experience complications related to urinary  retention Outcome: Progressing   Problem: Pain Managment: Goal: General experience of comfort will improve and/or be controlled Outcome: Progressing   Problem: Safety: Goal: Ability to remain free from injury will improve Outcome: Progressing   Problem: Skin Integrity: Goal: Risk for impaired skin integrity will decrease Outcome: Progressing

## 2024-01-24 LAB — SURGICAL PATHOLOGY

## 2024-04-25 ENCOUNTER — Telehealth: Admitting: Physician Assistant

## 2024-04-25 DIAGNOSIS — J069 Acute upper respiratory infection, unspecified: Secondary | ICD-10-CM

## 2024-04-25 DIAGNOSIS — B9689 Other specified bacterial agents as the cause of diseases classified elsewhere: Secondary | ICD-10-CM

## 2024-04-25 MED ORDER — BENZONATATE 100 MG PO CAPS: 100.0000 mg | ORAL_CAPSULE | Freq: Three times a day (TID) | ORAL | 0 refills | Status: AC | PRN

## 2024-04-25 MED ORDER — PREDNISONE 20 MG PO TABS: 40.0000 mg | ORAL_TABLET | Freq: Every day | ORAL | 0 refills | Status: AC

## 2024-04-25 MED ORDER — AZITHROMYCIN 250 MG PO TABS: ORAL_TABLET | ORAL | 0 refills | Status: AC

## 2024-04-25 NOTE — Progress Notes (Signed)
 We are sorry that you are not feeling well.  Here is how we plan to help!  Based on your presentation I believe you most likely have A cough due to bacteria.  When patients have a fever and a productive cough with a change in color or increased sputum production, we are concerned about bacterial bronchitis.  If left untreated it can progress to pneumonia.  If your symptoms do not improve with your treatment plan it is important that you contact your provider.   I have prescribed Azithromyin 250 mg: two tablets now and then one tablet daily for 4 additonal days    In addition you may use A non-prescription cough medication called Mucinex DM: take 2 tablets every 12 hours. and A prescription cough medication called Tessalon  Perles 100mg . You may take 1-2 capsules every 8 hours as needed for your cough.  I have also prescribed Prednisone  20mg  Take 2 tablets (40mg ) daily for 5 days.   From your responses in the eVisit questionnaire you describe inflammation in the upper respiratory tract which is causing a significant cough.  This is commonly called Bronchitis and has four common causes:   Allergies Viral Infections Acid Reflux Bacterial Infection Allergies, viruses and acid reflux are treated by controlling symptoms or eliminating the cause. An example might be a cough caused by taking certain blood pressure medications. You stop the cough by changing the medication. Another example might be a cough caused by acid reflux. Controlling the reflux helps control the cough.  USE OF BRONCHODILATOR (RESCUE) INHALERS: There is a risk from using your bronchodilator too frequently.  The risk is that over-reliance on a medication which only relaxes the muscles surrounding the breathing tubes can reduce the effectiveness of medications prescribed to reduce swelling and congestion of the tubes themselves.  Although you feel brief relief from the bronchodilator inhaler, your asthma may actually be worsening with  the tubes becoming more swollen and filled with mucus.  This can delay other crucial treatments, such as oral steroid medications. If you need to use a bronchodilator inhaler daily, several times per day, you should discuss this with your provider.  There are probably better treatments that could be used to keep your asthma under control.     HOME CARE Only take medications as instructed by your medical team. Complete the entire course of an antibiotic. Drink plenty of fluids and get plenty of rest. Avoid close contacts especially the very young and the elderly Cover your mouth if you cough or cough into your sleeve. Always remember to wash your hands A steam or ultrasonic humidifier can help congestion.   GET HELP RIGHT AWAY IF: You develop worsening fever. You become short of breath You cough up blood. Your symptoms persist after you have completed your treatment plan MAKE SURE YOU  Understand these instructions. Will watch your condition. Will get help right away if you are not doing well or get worse.  Your e-visit answers were reviewed by a board certified advanced clinical practitioner to complete your personal care plan.  Depending on the condition, your plan could have included both over the counter or prescription medications. If there is a problem please reply  once you have received a response from your provider. Your safety is important to us .  If you have drug allergies check your prescription carefully.    You can use MyChart to ask questions about today's visit, request a non-urgent call back, or ask for a work or school excuse  for 24 hours related to this e-Visit. If it has been greater than 24 hours you will need to follow up with your provider, or enter a new e-Visit to address those concerns. You will get an e-mail in the next two days asking about your experience.  I hope that your e-visit has been valuable and will speed your recovery. Thank you for using  e-visits.   I have spent 5 minutes in review of e-visit questionnaire, review and updating patient chart, medical decision making and response to patient.   Delon CHRISTELLA Dickinson, PA-C

## 2024-04-28 ENCOUNTER — Other Ambulatory Visit: Payer: Self-pay | Admitting: Family Medicine

## 2024-04-28 DIAGNOSIS — I10 Essential (primary) hypertension: Secondary | ICD-10-CM
